# Patient Record
Sex: Female | Born: 1995 | Race: White | Hispanic: No | Marital: Single | Smoking: Never smoker
Health system: Southern US, Community
[De-identification: ages and names within clinical notes are randomized; demographics above are authoritative.]

## PROBLEM LIST (undated history)

## (undated) ENCOUNTER — Inpatient Hospital Stay (HOSPITAL_COMMUNITY): Payer: BLUE CROSS/BLUE SHIELD

## (undated) DIAGNOSIS — A689 Relapsing fever, unspecified: Secondary | ICD-10-CM

## (undated) HISTORY — DX: Relapsing fever, unspecified: A68.9

## (undated) HISTORY — PX: WISDOM TOOTH EXTRACTION: SHX21

---
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
## 2015-05-01 ENCOUNTER — Ambulatory Visit (INDEPENDENT_AMBULATORY_CARE_PROVIDER_SITE_OTHER): Payer: BLUE CROSS/BLUE SHIELD | Admitting: Internal Medicine

## 2015-05-01 VITALS — BP 102/60 | HR 88 | Temp 99.1°F | Resp 16 | Ht 66.0 in | Wt 126.0 lb

## 2015-05-01 DIAGNOSIS — G4700 Insomnia, unspecified: Secondary | ICD-10-CM | POA: Diagnosis not present

## 2015-05-01 DIAGNOSIS — M2550 Pain in unspecified joint: Secondary | ICD-10-CM | POA: Diagnosis not present

## 2015-05-01 DIAGNOSIS — R634 Abnormal weight loss: Secondary | ICD-10-CM | POA: Diagnosis not present

## 2015-05-01 DIAGNOSIS — J029 Acute pharyngitis, unspecified: Secondary | ICD-10-CM | POA: Diagnosis not present

## 2015-05-01 DIAGNOSIS — M542 Cervicalgia: Secondary | ICD-10-CM | POA: Diagnosis not present

## 2015-05-01 DIAGNOSIS — R42 Dizziness and giddiness: Secondary | ICD-10-CM

## 2015-05-01 DIAGNOSIS — R630 Anorexia: Secondary | ICD-10-CM | POA: Diagnosis not present

## 2015-05-01 LAB — POCT CBC
Granulocyte percent: 80.9 % — AB (ref 37–80)
HCT, POC: 41.6 % (ref 37.7–47.9)
Hemoglobin: 14.2 g/dL (ref 12.2–16.2)
Lymph, poc: 1.5 (ref 0.6–3.4)
MCH, POC: 30 pg (ref 27–31.2)
MCHC: 34.2 g/dL (ref 31.8–35.4)
MCV: 87.7 fL (ref 80–97)
MID (cbc): 0.9 (ref 0–0.9)
MPV: 6.9 fL (ref 0–99.8)
POC Granulocyte: 10.2 — AB (ref 2–6.9)
POC LYMPH PERCENT: 12.3 % (ref 10–50)
POC MID %: 6.8 % (ref 0–12)
Platelet Count, POC: 215 K/uL (ref 142–424)
RBC: 4.74 M/uL (ref 4.04–5.48)
RDW, POC: 12.9 %
WBC: 12.6 K/uL — AB (ref 4.6–10.2)

## 2015-05-01 LAB — COMPREHENSIVE METABOLIC PANEL WITH GFR
ALT: 9 U/L (ref 5–32)
AST: 12 U/L (ref 12–32)
Albumin: 4.9 g/dL (ref 3.6–5.1)
Alkaline Phosphatase: 55 U/L (ref 47–176)
BUN: 9 mg/dL (ref 7–20)
CO2: 24 mmol/L (ref 20–31)
Calcium: 9.8 mg/dL (ref 8.9–10.4)
Chloride: 103 mmol/L (ref 98–110)
Creat: 0.78 mg/dL (ref 0.50–1.00)
Glucose, Bld: 89 mg/dL (ref 65–99)
Potassium: 4.2 mmol/L (ref 3.8–5.1)
Sodium: 137 mmol/L (ref 135–146)
Total Bilirubin: 0.5 mg/dL (ref 0.2–1.1)
Total Protein: 7.5 g/dL (ref 6.3–8.2)

## 2015-05-01 LAB — TSH: TSH: 1.42 u[IU]/mL (ref 0.350–4.500)

## 2015-05-01 LAB — POCT SEDIMENTATION RATE: POCT SED RATE: 20 mm/hr (ref 0–22)

## 2015-05-01 MED ORDER — AZITHROMYCIN 250 MG PO TABS: ORAL_TABLET | ORAL | Status: DC

## 2015-05-01 NOTE — Progress Notes (Signed)
Subjective:  This chart was scribed for Andrea Sia, MD by Andrew Au, ED Scribe. This patient was seen in room 4 and the patient's care was started at 8:50 AM.  Patient ID: Andrea Foley, female    DOB: [birth year], 19 y.o.   MRN: [id number]  HPI   Chief Complaint  Patient presents with  . Nasal Congestion    x 2 days   . Neck Pain  . Sore Throat  . Night Sweats   HPI Comments: Andrea Foley is a 19 y.o. female who presents to the [facility] complaining of   Sore throat  Pt has a sore throat and neck pain. She reports associated bloody post nasal drip, cough, worse at night, body aches, pain to both armpits, night sweat and fever. She has tried mucinex and tylenol without relief to symptoms.  Decreased appetite  She's also had a decreased appetite and nausea for the past 2 months. She states she had loss a total of 10-12 lbs within the last few months. Mother has also notice change in appetite and weight.   Light headedness  She reports light headedness and wooziness with quick movement, from sitting to standing. She states she had an episode a couple days ago of feeling light headed while taking a shower and almost blacking out. Pt does light exercising; goes to the park or walk trails a couple times a week. She has noticed achines in joints including her hips knee and ankle before bed time.   Trouble sleeping  She had a metabolic panel at gynecologist 3 month ago. She has trouble sleeping, waking up at 6 am not being able to go back to sleep. Last night she went to sleep at 1am and woke up at 6:50am. When she was seen by gynecologist 3 months ago, she was told she was anxious waking up from sleep due to having palpitation with fast heart rate and nervousness first thing when she wakes up. She denies hx of anxiety but recently had an episode a couple days ago with her friend, having to stop what she was doing and calm herself down..   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 Pt states she left school after becoming pregnant and having an abortion. She is tranfering back to [place] tech in January. She is on birth control but lost her pack for this month, so will restart next month.   She denies urinary frequency, skin rash, leg cramping.    Patient Active Problem List   Diagnosis Date Noted  . Seasonal allergies 09/22/2013   Past Medical History  Diagnosis Date  . Recurrent fever 09/22/2013   Past Surgical History  Procedure Laterality Date  . Wisdom tooth extraction     No Known Allergies Prior to Admission medications   Medication Sig Start Date End Date Taking? Authorizing Provider  benzonatate (TESSALON) 100 MG capsule Take 1-2 capsules (100-200 mg total) by mouth 3 (three) times daily as needed for cough. 09/13/14   Chelle Jeffery, PA-C  Guaifenesin (MUCINEX MAXIMUM STRENGTH) 1200 MG TB12 Take 1 tablet (1,200 mg total) by mouth every 12 (twelve) hours as needed. 09/13/14   Chelle Jeffery, PA-C  ipratropium (ATROVENT) 0.03 % nasal spray Place 2 sprays into both nostrils 2 (two) times daily. 09/13/14   Porfirio Oar, PA-C   Social History   Social History  .  Marital Status: Single    Spouse Name: n/a  . Number of Children: 0  . Years of Education: 12th grade   Occupational History  . waitress     [employer]   Social History Main Topics  . Smoking status: Never Smoker   . Smokeless tobacco: Never Used  . Alcohol Use: No  . Drug Use: Yes    Special: Marijuana     Comment: "not often"  . Sexual Activity: No   Other Topics Concern  . Not on file   Social History Narrative   Graduated from [employer] [id number].   Lives with her best friend.   [place] in [place], [place].   Review of Systems  Constitutional: Positive for fever, diaphoresis, activity change and appetite change.  HENT: Positive for congestion,  ear pain and sore throat.   Respiratory: Positive for cough.   Gastrointestinal: Positive for nausea. Negative for vomiting.  Genitourinary: Negative for dysuria, frequency and difficulty urinating.  Musculoskeletal: Positive for myalgias, arthralgias and neck pain.  Skin: Negative for rash.  Neurological: Positive for light-headedness.       Objective:   Physical Exam  Constitutional: She is oriented to person, place, and time. She appears well-developed and well-nourished. No distress.  HENT:  Head: Normocephalic and atraumatic.  Right Ear: External ear normal.  Left Ear: External ear normal.  Nose: Nose normal.  Mouth/Throat: Oropharynx is clear and moist. No oropharyngeal exudate.  Eyes: Conjunctivae and EOM are normal. Pupils are equal, round, and reactive to light.  Neck: Neck supple.  Tenderness in both SCM's left greater than right. Some tenderness in left trapezius but neck is not rigid    Cardiovascular: Normal rate, regular rhythm and normal heart sounds.   No murmur heard. Pulmonary/Chest: Effort normal and breath sounds normal. She has no wheezes. She has no rales.  Abdominal:  Tender in LUQ with a palpable spleen  Musculoskeletal: Normal range of motion.  Tender in both hips with ROM. No swelling in any of the joints.   Neurological: She is alert and oriented to person, place, and time.  Skin: Skin is warm and dry. No rash noted.  Psychiatric: She has a normal mood and affect. Her behavior is normal.  Nursing note and vitals reviewed.   Filed Vitals:   05/01/15 0843  BP: 102/60  Pulse: 88  Temp: 99.1 F (37.3 C)  TempSrc: Oral  Resp: 16  Height: 5\' 6"  (1.676 m)  Weight: 126 lb (57.153 kg)  SpO2: 98%   Wt Readings from Last 3 Encounters:  05/01/15 126 lb (57.153 kg) (46 %*, Z = -0.11)  09/13/14 139 lb (63.05 kg) (70 %*, Z = 0.51)  01/07/14 147 lb (66.679 kg) (81 %*, Z = 0.86)   * Growth percentiles are based on CDC 2-20 Years data.   Results for  orders placed or performed in visit on 05/01/15  Comprehensive metabolic panel  Result Value Ref Range   Sodium 137 135 - 146 mmol/L   Potassium 4.2 3.8 - 5.1 mmol/L   Chloride 103 98 - 110 mmol/L   CO2 24 20 - 31 mmol/L   Glucose, Bld 89 65 - 99 mg/dL   BUN 9 7 - 20 mg/dL   Creat 2.720.78 5.360.50 - 6.441.00 mg/dL   Total Bilirubin 0.5 0.2 - 1.1 mg/dL   Alkaline Phosphatase 55 47 - 176 U/L   AST 12 12 - 32 U/L   ALT 9 5 - 32 U/L   Total  Protein 7.5 6.3 - 8.2 g/dL   Albumin 4.9 3.6 - 5.1 g/dL   Calcium 9.8 8.9 - 16.1 mg/dL  TSH  Result Value Ref Range   TSH 1.420 0.350 - 4.500 uIU/mL  POCT SEDIMENTATION RATE  Result Value Ref Range   POCT SED RATE 20 0 - 22 mm/hr  POCT CBC  Result Value Ref Range   WBC 12.6 (A) 4.6 - 10.2 K/uL   Lymph, poc 1.5 0.6 - 3.4   POC LYMPH PERCENT 12.3 10 - 50 %L   MID (cbc) 0.9 0 - 0.9   POC MID % 6.8 0 - 12 %M   POC Granulocyte 10.2 (A) 2 - 6.9   Granulocyte percent 80.9 (A) 37 - 80 %G   RBC 4.74 4.04 - 5.48 M/uL   Hemoglobin 14.2 12.2 - 16.2 g/dL   HCT, POC 09.6 04.5 - 47.9 %   MCV 87.7 80 - 97 fL   MCH, POC 30.0 27 - 31.2 pg   MCHC 34.2 31.8 - 35.4 g/dL   RDW, POC 40.9 %   Platelet Count, POC 215 142 - 424 K/uL   MPV 6.9 0 - 99.8 fL   Assessment & Plan:    1. Loss of appetite   2. Arthralgia   3. Sore neck   4. Loss of weight   5. Insomnia   6. Lightheaded     Orders Placed This Encounter  Procedures  . Culture, Group A Strep  . Comprehensive metabolic panel  . TSH  . POCT SEDIMENTATION RATE  . POCT CBC    Meds ordered this encounter  Medications  . azithromycin (ZITHROMAX) 250 MG tablet    Sig: As packaged    Dispense:  6 tablet    Refill:  0   I have completed the patient encounter in its entirety as documented by the scribe, with editing by me where necessary. Leroy Pettway P. Merla Riches, M.D.  Although suspicious for bacterial tonsillitis as one part of this presentation, strep culture was negative. We will reassess her in 1-2  weeks if she is not completely normal.

## 2015-05-02 LAB — CULTURE, GROUP A STREP: ORGANISM ID, BACTERIA: NORMAL

## 2015-05-07 ENCOUNTER — Telehealth: Payer: Self-pay | Admitting: Internal Medicine

## 2015-05-07 NOTE — Telephone Encounter (Signed)
Can we do this for pt? 

## 2015-05-07 NOTE — Telephone Encounter (Signed)
Patient called for several issues:   1. She wants to know her lab results 2. She wants to pick up a copy of her results 3. She is requesting a note for school stating that she was seen in the office on 05/01/2015. States that the note should say something to the effect of "she tried to be seen prior for this but could not because of the wait time." She states that she is going to fail a class if she does not have this note.  [phone]

## 2015-05-07 NOTE — Telephone Encounter (Signed)
Ok to write a note stating that she was seen on the day she was seen - anything further will need to come from Dr Merla Richesoolittle who is out of the office. Her outside labs were all ok.

## 2015-05-10 NOTE — Telephone Encounter (Signed)
Please review labs, I do not see any notes

## 2015-05-12 NOTE — Telephone Encounter (Signed)
Left message for pt to call back  °

## 2015-05-12 NOTE — Telephone Encounter (Signed)
i can write a letter explaining when her illness got worse 7 d before she came in---would that help??

## 2015-05-14 ENCOUNTER — Encounter: Payer: Self-pay | Admitting: Internal Medicine

## 2015-05-14 ENCOUNTER — Telehealth: Payer: Self-pay

## 2015-05-14 NOTE — Telephone Encounter (Signed)
Pt came by and states 7 days earlier is ok.

## 2015-06-14 ENCOUNTER — Encounter (HOSPITAL_COMMUNITY): Payer: Self-pay

## 2015-06-14 ENCOUNTER — Emergency Department (HOSPITAL_COMMUNITY)
Admission: EM | Admit: 2015-06-14 | Discharge: 2015-06-14 | Disposition: A | Discharge status: Home / Self Care | Payer: BLUE CROSS/BLUE SHIELD | Attending: Emergency Medicine | Admitting: Emergency Medicine

## 2015-06-14 DIAGNOSIS — E669 Obesity, unspecified: Secondary | ICD-10-CM | POA: Diagnosis not present

## 2015-06-14 DIAGNOSIS — Z79899 Other long term (current) drug therapy: Secondary | ICD-10-CM | POA: Diagnosis not present

## 2015-06-14 DIAGNOSIS — N912 Amenorrhea, unspecified: Secondary | ICD-10-CM | POA: Insufficient documentation

## 2015-06-14 DIAGNOSIS — F419 Anxiety disorder, unspecified: Secondary | ICD-10-CM | POA: Diagnosis not present

## 2015-06-14 NOTE — ED Provider Notes (Signed)
CSN: [id number]     Arrival date & time 06/14/15  1138 History   None    No chief complaint on file.  chief complaint anxiety Chief complaint anxiety  (Consider location/radiation/quality/duration/timing/severity/associated sxs/prior Treatment) HPI Patient reports she's been waking up with anxiety attacks for the past year. She is brought here this morning on assistance of her mother who accompanies her.. Patient expressed vague thoughts of suicide this morning to her mother, though presently she denies wanting to harm herself or others. She's been treated in the past by her gynecologist with Prozac, without relief of panic attacks. Present she offers no complaints of feeling "tense" nothing makes symptoms better or worse Past Medical History  Diagnosis Date  . Recurrent fever 09/22/2013   Past Surgical History  Procedure Laterality Date  . Wisdom tooth extraction     Family History  Problem Relation Age of Onset  . Hypertension Father   . Hyperlipidemia Father   . Diabetes Father    Social History  Substance Use Topics  . Smoking status: Never Smoker   . Smokeless tobacco: Never Used  . Alcohol Use: No   OB History    No data available     Review of Systems  Constitutional: Negative.   HENT: Negative.   Respiratory: Negative.   Cardiovascular: Negative.   Gastrointestinal: Negative.   Genitourinary:       Amenorrhea  Musculoskeletal: Negative.   Skin: Negative.   Neurological: Negative.   Psychiatric/Behavioral: Positive for dysphoric mood. The patient is nervous/anxious.   All other systems reviewed and are negative.     Allergies  Review of patient's allergies indicates no known allergies.  Home Medications   Prior to Admission medications   Medication Sig Start Date End Date Taking? Authorizing Provider  azithromycin (ZITHROMAX) 250 MG tablet As packaged 05/01/15   Tonye Pearson, MD  benzonatate (TESSALON) 100 MG capsule Take 1-2 capsules (100-200 mg  total) by mouth 3 (three) times daily as needed for cough. 09/13/14   Chelle Jeffery, PA-C  Guaifenesin (MUCINEX MAXIMUM STRENGTH) 1200 MG TB12 Take 1 tablet (1,200 mg total) by mouth every 12 (twelve) hours as needed. 09/13/14   Chelle Jeffery, PA-C  ipratropium (ATROVENT) 0.03 % nasal spray Place 2 sprays into both nostrils 2 (two) times daily. 09/13/14   Chelle Jeffery, PA-C   There were no vitals taken for this visit. Physical Exam  Constitutional: She is oriented to person, place, and time. She appears well-developed and well-nourished.  HENT:  Head: Normocephalic and atraumatic.  Eyes: Conjunctivae are normal. Pupils are equal, round, and reactive to light.  Neck: Neck supple. No tracheal deviation present. No thyromegaly present.  Cardiovascular: Normal rate and regular rhythm.   No murmur heard. Pulmonary/Chest: Effort normal and breath sounds normal.  Abdominal: Soft. Bowel sounds are normal. She exhibits no distension. There is no tenderness.  Obese  Musculoskeletal: Normal range of motion. She exhibits no edema or tenderness.  Neurological: She is alert and oriented to person, place, and time. No cranial nerve deficit. Coordination normal.  Gait normal  Skin: Skin is warm and dry. No rash noted.  Psychiatric: She has a normal mood and affect.  Appears mildly angry  Nursing note and vitals reviewed.   ED Course  Procedures (including critical care time) Labs Review Labs Reviewed - No data to display  Imaging Review No results found. I have personally reviewed and evaluated these images and lab results as part of my medical decision-making.  EKG Interpretation None     Patient interviewed by TTS. MDM  Plan resource furnished for outpatient therapy. Patient urged to call 911 is any thought of harming self Final diagnoses:  None   diagnosis anxiety      Doug SouSam Alizeh Madril, MD 06/14/15 1338

## 2015-06-14 NOTE — ED Notes (Signed)
TTS at bedside. 

## 2015-06-14 NOTE — ED Notes (Addendum)
Pt's mother crying outside room.  Was offered coffee, tissues, and was placed into a private family room to wait for TTS to finish assessment.

## 2015-06-14 NOTE — Discharge Instructions (Signed)
Generalized Anxiety Disorder Call the telephone number that you've  Been furnished today to schedule the next available appointment for counseling for anxiety. If you have any thought of harming yourself or someone else call 911 immediately. Generalized anxiety disorder (GAD) is a mental disorder. It interferes with life functions, including relationships, work, and school. GAD is different from normal anxiety, which everyone experiences at some point in their lives in response to specific life events and activities. Normal anxiety actually helps us prepare for and get through these life events and activities. Normal anxiety goes away after the event or activity is over.  GAD causes anxiety that is not necessarily related to specific events or activities. It also causes excess anxiety in proportion to specific events or activities. The anxiety associated with GAD is also difficult to control. GAD can vary from mild to severe. People with severe GAD can have intense waves of anxiety with physical symptoms (panic attacks).  SYMPTOMS The anxiety and worry associated with GAD are difficult to control. This anxiety and worry are related to many life events and activities and also occur more days than not for 6 months or longer. People with GAD also have three or more of the following symptoms (one or more in children):  Restlessness.   Fatigue.  Difficulty concentrating.   Irritability.  Muscle tension.  Difficulty sleeping or unsatisfying sleep. DIAGNOSIS GAD is diagnosed through an assessment by your health care provider. Your health care provider will ask you questions aboutyour mood,physical symptoms, and events in your life. Your health care provider may ask you about your medical history and use of alcohol or drugs, including prescription medicines. Your health care provider may also do a physical exam and blood tests. Certain medical conditions and the use of certain substances can cause  symptoms similar to those associated with GAD. Your health care provider may refer you to a mental health specialist for further evaluation. TREATMENT The following therapies are usually used to treat GAD:   Medication. Antidepressant medication usually is prescribed for long-term daily control. Antianxiety medicines may be added in severe cases, especially when panic attacks occur.   Talk therapy (psychotherapy). Certain types of talk therapy can be helpful in treating GAD by providing support, education, and guidance. A form of talk therapy called cognitive behavioral therapy can teach you healthy ways to think about and react to daily life events and activities.  Stress managementtechniques. These include yoga, meditation, and exercise and can be very helpful when they are practiced regularly. A mental health specialist can help determine which treatment is best for you. Some people see improvement with one therapy. However, other people require a combination of therapies.   This information is not intended to replace advice given to you by your health care provider. Make sure you discuss any questions you have with your health care provider.   Document Released: 09/16/2012 Document Revised: 06/12/2014 Document Reviewed: 09/16/2012 Elsevier Interactive Patient Education [employer]2016 [employer] Inc.
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
## 2015-06-14 NOTE — ED Notes (Addendum)
Pt c/o increasing anxiety x 1 year.  Pt reports being seen previously by PCP for same.  Sts "they put me on Prozac one time for depression."  Denies SI/HI/AV.  Sts "I'm not crazy dude."  Mother reports Pt "has been battling this on and off for a while and needs to get it under control."  Pt is supposed to start college on Friday.

## 2015-06-19 ENCOUNTER — Encounter (HOSPITAL_COMMUNITY): Payer: Self-pay | Admitting: Emergency Medicine

## 2015-06-19 ENCOUNTER — Emergency Department (INDEPENDENT_AMBULATORY_CARE_PROVIDER_SITE_OTHER)
Admission: EM | Admit: 2015-06-19 | Discharge: 2015-06-19 | Disposition: A | Discharge status: Home / Self Care | Payer: BLUE CROSS/BLUE SHIELD | Source: Home / Self Care

## 2015-06-19 DIAGNOSIS — J069 Acute upper respiratory infection, unspecified: Secondary | ICD-10-CM

## 2015-06-19 DIAGNOSIS — K529 Noninfective gastroenteritis and colitis, unspecified: Secondary | ICD-10-CM | POA: Diagnosis not present

## 2015-06-19 DIAGNOSIS — E860 Dehydration: Secondary | ICD-10-CM

## 2015-06-19 LAB — POCT URINALYSIS DIP (DEVICE)
GLUCOSE, UA: NEGATIVE mg/dL
Leukocytes, UA: NEGATIVE
Nitrite: NEGATIVE
PROTEIN: 100 mg/dL — AB
Urobilinogen, UA: 0.2 mg/dL (ref 0.0–1.0)
pH: 6 (ref 5.0–8.0)

## 2015-06-19 LAB — POCT PREGNANCY, URINE: Preg Test, Ur: NEGATIVE

## 2015-06-19 MED ORDER — OSELTAMIVIR PHOSPHATE 75 MG PO CAPS: 75.0000 mg | ORAL_CAPSULE | Freq: Two times a day (BID) | ORAL | Status: AC

## 2015-06-19 MED ORDER — PROMETHAZINE HCL 25 MG PO TABS: 25.0000 mg | ORAL_TABLET | Freq: Four times a day (QID) | ORAL | Status: DC | PRN

## 2015-06-19 NOTE — ED Provider Notes (Addendum)
CSN: [id number]     Arrival date & time 06/19/15  1325 History   None    No chief complaint on file.  (Consider location/radiation/quality/duration/timing/severity/associated sxs/prior Treatment) Patient is a 20 y.o. female presenting with cough and vomiting. The history is provided by the patient. No language interpreter was used.  Cough Cough characteristics:  Non-productive Severity:  Moderate Onset quality:  Gradual Timing:  Constant (Feels better with mucinex) Progression:  Waxing and waning Chronicity:  New Smoker: no   Context: sick contacts   Context: not occupational exposure and not smoke exposure   Context comment:  Three dogs at home, denies animal allergies. Was in contact with a friend who was recently diagnosed with flu Relieved by:  Cough suppressants Exacerbated by: worse in the morning. And when she starts coughing she is unable to stop as quickly as possible. Associated symptoms: fever, myalgias, rhinorrhea, shortness of breath, sinus congestion and wheezing   Associated symptoms: no chest pain, no ear pain, no eye discharge, no headaches, no rash and no sore throat   Associated symptoms comment:  Measured after taking tylenol, temp check was 99 Emesis Severity: Feels naseous right after eating. She has not been able to keep food down in 2 days. Now she has poor appetite. Duration:  3 days Timing:  Constant Quality:  Stomach contents and bilious material Able to tolerate:  Liquids How soon after eating does vomiting occur:  1 second Progression:  Worsening Chronicity:  New Recent urination:  Normal Context: not post-tussive and not self-induced   Relieved by: not eating. Worsened by:  Food smell Associated symptoms: abdominal pain, arthralgias, cough, fever and myalgias   Associated symptoms: no headaches and no sore throat   Associated symptoms comment:  If she eats a lot she will have diarrhea, she has some belly pain on and off Risk factors: sick contacts    Risk factors: no suspect food intake and no travel to endemic areas   Risk factors comment:  LMP was 06/03/15   Past Medical History  Diagnosis Date  . Recurrent fever 09/22/2013   Past Surgical History  Procedure Laterality Date  . Wisdom tooth extraction     Family History  Problem Relation Age of Onset  . Hypertension Father   . Hyperlipidemia Father   . Diabetes Father    Social History  Substance Use Topics  . Smoking status: Never Smoker   . Smokeless tobacco: Never Used  . Alcohol Use: 0.0 oz/week    0 Standard drinks or equivalent per week     Comment: occ   OB History    No data available     Review of Systems  Constitutional: Positive for fever.  HENT: Positive for rhinorrhea. Negative for ear pain and sore throat.   Eyes: Negative for discharge.  Respiratory: Positive for cough, shortness of breath and wheezing.   Cardiovascular: Negative.  Negative for chest pain.  Gastrointestinal: Positive for vomiting and abdominal pain.  Genitourinary: Negative.   Musculoskeletal: Positive for myalgias and arthralgias.  Skin: Negative for rash.  Neurological: Negative for headaches.  All other systems reviewed and are negative.   Allergies  Review of patient's allergies indicates no known allergies.  Home Medications   Prior to Admission medications   Medication Sig Start Date End Date Taking? Authorizing Provider  azithromycin (ZITHROMAX) 250 MG tablet As packaged 05/01/15   Tonye Pearson, MD  benzonatate (TESSALON) 100 MG capsule Take 1-2 capsules (100-200 mg total) by mouth 3 (three)  times daily as needed for cough. 09/13/14   Chelle Jeffery, PA-C  Guaifenesin (MUCINEX MAXIMUM STRENGTH) 1200 MG TB12 Take 1 tablet (1,200 mg total) by mouth every 12 (twelve) hours as needed. 09/13/14   Chelle Jeffery, PA-C  ipratropium (ATROVENT) 0.03 % nasal spray Place 2 sprays into both nostrils 2 (two) times daily. 09/13/14   Porfirio Oarhelle Jeffery, PA-C   Meds Ordered and  Administered this Visit  Medications - No data to display  LMP 05/30/2015 No data found.   Physical Exam  Constitutional: She is oriented to person, place, and time. She appears well-developed. No distress.  HENT:  Head: Normocephalic.  Right Ear: Tympanic membrane, external ear and ear canal normal.  Left Ear: Tympanic membrane, external ear and ear canal normal.  Mouth/Throat: Uvula is midline, oropharynx is clear and moist and mucous membranes are normal.  Cardiovascular: Normal rate, regular rhythm and normal heart sounds.   No murmur heard. Pulmonary/Chest: Effort normal and breath sounds normal. No respiratory distress. She has no wheezes. She has no rales. She exhibits no tenderness.  Abdominal: Soft. Bowel sounds are normal. She exhibits no distension and no mass. There is no tenderness.  Musculoskeletal: Normal range of motion.  Neurological: She is alert and oriented to person, place, and time. She has normal reflexes. No cranial nerve deficit.  Nursing note and vitals reviewed.   ED Course  Procedures (including critical care time)  Labs Review Labs Reviewed - No data to display  Imaging Review No results found.   Visual Acuity Review  Right Eye Distance:   Left Eye Distance:   Bilateral Distance:    Right Eye Near:   Left Eye Near:    Bilateral Near:      Urinalysis    Component Value Date/Time   LABSPEC >=1.030 06/19/2015 1433   PHURINE 6.0 06/19/2015 1433   GLUCOSEU NEGATIVE 06/19/2015 1433   HGBUR MODERATE* 06/19/2015 1433   BILIRUBINUR SMALL* 06/19/2015 1433   BILIRUBINUR neg 08/29/2013 1516   KETONESUR >=160* 06/19/2015 1433   PROTEINUR 100* 06/19/2015 1433   PROTEINUR 30 08/29/2013 1516   UROBILINOGEN 0.2 06/19/2015 1433   UROBILINOGEN 0.2 08/29/2013 1516   NITRITE NEGATIVE 06/19/2015 1433   NITRITE neg 08/29/2013 1516   LEUKOCYTESUR NEGATIVE 06/19/2015 1433     Pregnancy test Neg   MDM  No diagnosis found. Upper respiratory  infection, viral  Gastroenteritis  Mild dehydration  Concern for influenza due to hx of exposure and symptoms. Tamiflu prescribed. May use Tylenol as needed for pain and fever. OTC cough syrup is appropriate. Phernegan prescribed prn N/V. I recommended improve hydration and rest at home. UA suggest dehydration with ketone and protein in urine. She also has hgb in her urine. I recommended repeat UA 1-2 weeks after feeling better. She will f/u with her PCP.    Doreene ElandKehinde T Lashawne Dura, MD 06/19/15 1505  Doreene ElandKehinde T Finnegan Gatta, MD 06/19/15 [phone]

## 2015-06-19 NOTE — Discharge Instructions (Signed)

## 2015-06-19 NOTE — ED Notes (Signed)
Pt is here today with c/o loss of appetite, nausea and vomiting and slight abdominal cramp intermit x 1 week Seen in ER and treated with anxiety issues LMP- 12/29 othostats negative

## 2016-03-08 ENCOUNTER — Ambulatory Visit (HOSPITAL_COMMUNITY)
Admission: EM | Admit: 2016-03-08 | Discharge: 2016-03-08 | Disposition: A | Discharge status: Home / Self Care | Payer: BLUE CROSS/BLUE SHIELD | Attending: Family Medicine | Admitting: Family Medicine

## 2016-03-08 ENCOUNTER — Encounter (HOSPITAL_COMMUNITY): Payer: Self-pay | Admitting: Emergency Medicine

## 2016-03-08 DIAGNOSIS — N3000 Acute cystitis without hematuria: Secondary | ICD-10-CM | POA: Diagnosis not present

## 2016-03-08 DIAGNOSIS — R109 Unspecified abdominal pain: Secondary | ICD-10-CM | POA: Diagnosis present

## 2016-03-08 LAB — POCT URINALYSIS DIP (DEVICE)
Glucose, UA: 250 mg/dL — AB
Hgb urine dipstick: NEGATIVE
Nitrite: POSITIVE — AB
PH: 8.5 — AB (ref 5.0–8.0)
Specific Gravity, Urine: 1.015 (ref 1.005–1.030)
Urobilinogen, UA: 4 mg/dL — ABNORMAL HIGH (ref 0.0–1.0)

## 2016-03-08 LAB — POCT PREGNANCY, URINE: PREG TEST UR: NEGATIVE

## 2016-03-08 MED ORDER — PHENAZOPYRIDINE HCL 200 MG PO TABS: 200.0000 mg | ORAL_TABLET | Freq: Three times a day (TID) | ORAL | 0 refills | Status: AC

## 2016-03-08 MED ORDER — NITROFURANTOIN MONOHYD MACRO 100 MG PO CAPS: 100.0000 mg | ORAL_CAPSULE | Freq: Two times a day (BID) | ORAL | 0 refills | Status: AC

## 2016-03-08 NOTE — ED Provider Notes (Signed)
CSN: [id number]     Arrival date & time 03/08/16  1042 History   First MD Initiated Contact with Patient 03/08/16 1223     Chief Complaint  Patient presents with  . Abdominal Pain   (Consider location/radiation/quality/duration/timing/severity/associated sxs/prior Treatment) Patient presents today for possible UTI. She reports lower abdominal pain, bilateral flank pain, severe dysuria, urinary frequency and urgency. She also endorses vaginal discharge but is not any abnormal that her normal vaginal discharge.       Past Medical History:  Diagnosis Date  . Recurrent fever 09/22/2013   Past Surgical History:  Procedure Laterality Date  . WISDOM TOOTH EXTRACTION     Family History  Problem Relation Age of Onset  . Hypertension Father   . Hyperlipidemia Father   . Diabetes Father    Social History  Substance Use Topics  . Smoking status: Never Smoker  . Smokeless tobacco: Never Used  . Alcohol use 0.0 oz/week     Comment: occ   OB History    No data available     Review of Systems  Constitutional: Negative for chills, fatigue and fever.  Gastrointestinal: Positive for abdominal pain. Negative for diarrhea, nausea and vomiting.  Genitourinary: Positive for dysuria, flank pain, frequency, urgency and vaginal discharge.       Reports vaginal discharge is not any abnormal  Neurological: Negative for dizziness and headaches.    Allergies  Review of patient's allergies indicates no known allergies.  Home Medications   Prior to Admission medications   Medication Sig Start Date End Date Taking? Authorizing Provider  azithromycin (ZITHROMAX) 250 MG tablet As packaged 05/01/15   Tonye Pearsonobert P Doolittle, MD  benzonatate (TESSALON) 100 MG capsule Take 1-2 capsules (100-200 mg total) by mouth 3 (three) times daily as needed for cough. 09/13/14   Chelle Jeffery, PA-C  Guaifenesin (MUCINEX MAXIMUM STRENGTH) 1200 MG TB12 Take 1 tablet (1,200 mg total) by mouth every 12 (twelve) hours as  needed. 09/13/14   Chelle Jeffery, PA-C  ipratropium (ATROVENT) 0.03 % nasal spray Place 2 sprays into both nostrils 2 (two) times daily. 09/13/14   Chelle Jeffery, PA-C  nitrofurantoin, macrocrystal-monohydrate, (MACROBID) 100 MG capsule Take 1 capsule (100 mg total) by mouth 2 (two) times daily. 03/08/16 03/13/16  Lucia EstelleFeng Christopher Hink, NP  phenazopyridine (PYRIDIUM) 200 MG tablet Take 1 tablet (200 mg total) by mouth 3 (three) times daily. 03/08/16 03/10/16  Lucia EstelleFeng Brenisha Tsui, NP  promethazine (PHENERGAN) 25 MG tablet Take 1 tablet (25 mg total) by mouth every 6 (six) hours as needed for nausea or vomiting. 06/19/15   Doreene ElandKehinde T Eniola, MD   Meds Ordered and Administered this Visit  Medications - No data to display  BP (!) 109/53 (BP Location: Left Arm) Comment: reported BP to CMA [facility]  Pulse 69   Temp 98.5 F (36.9 C) (Oral)   Resp 16   LMP 02/22/2016 (Exact Date)   SpO2 100%  No data found.   Physical Exam  Constitutional: She is oriented to person, place, and time. She appears well-developed and well-nourished.  HENT:  Head: Normocephalic and atraumatic.  Cardiovascular: Normal rate, regular rhythm and normal heart sounds.   Pulmonary/Chest: Effort normal and breath sounds normal.  Abdominal: Soft. Bowel sounds are normal.  +suprapubic tenderness present.   Genitourinary:  Genitourinary Comments: +Bilateral CVA tenderness (Left worst than right)  Neurological: She is alert and oriented to person, place, and time.  Nursing note and vitals reviewed.   Urgent Care Course   Clinical  Course    Procedures (including critical care time)  Labs Review Labs Reviewed  POCT URINALYSIS DIP (DEVICE) - Abnormal; Notable for the following:       Result Value   Glucose, UA 250 (*)    Bilirubin Urine SMALL (*)    Ketones, ur TRACE (*)    pH 8.5 (*)    Protein, ur >=300 (*)    Urobilinogen, UA 4.0 (*)    Nitrite POSITIVE (*)    Leukocytes, UA LARGE (*)    All other components within normal  limits  URINE CULTURE  POCT PREGNANCY, URINE    Imaging Review No results found.      MDM   1. Acute cystitis without hematuria     UA indicatives of UTI. Macrobid and Pyridium given. Reviewed directions for usage and side effects. Patient states understanding and will call with questions or problems. Patient instructed to call or follow up with his/her primary care doctor if failure to improve or change in symptoms. Discharge instruction given.    Lucia Estelle, NP 03/08/16 [phone]

## 2016-03-08 NOTE — Discharge Instructions (Signed)
Take the medications as prescribed. We send your urine off for a urine culture. Drink a lot of water with the pyridium medication. Follow up with your regular doctor if you do not improve.

## 2016-03-08 NOTE — ED Triage Notes (Signed)
[facility] a complaint of suprapubic pain as well as low back pain with urinary frequency and urgency that started 2 days ago.

## 2016-03-10 LAB — URINE CULTURE

## 2017-10-10 ENCOUNTER — Emergency Department (HOSPITAL_COMMUNITY): Payer: BLUE CROSS/BLUE SHIELD

## 2017-10-10 ENCOUNTER — Other Ambulatory Visit: Payer: Self-pay

## 2017-10-10 ENCOUNTER — Encounter (HOSPITAL_COMMUNITY): Payer: Self-pay

## 2017-10-10 ENCOUNTER — Emergency Department (HOSPITAL_COMMUNITY)
Admission: EM | Admit: 2017-10-10 | Discharge: 2017-10-10 | Disposition: A | Discharge status: Home / Self Care | Payer: BLUE CROSS/BLUE SHIELD | Attending: Emergency Medicine | Admitting: Emergency Medicine

## 2017-10-10 DIAGNOSIS — Z79899 Other long term (current) drug therapy: Secondary | ICD-10-CM | POA: Insufficient documentation

## 2017-10-10 DIAGNOSIS — O039 Complete or unspecified spontaneous abortion without complication: Secondary | ICD-10-CM | POA: Diagnosis not present

## 2017-10-10 DIAGNOSIS — Z3A00 Weeks of gestation of pregnancy not specified: Secondary | ICD-10-CM | POA: Diagnosis not present

## 2017-10-10 LAB — BASIC METABOLIC PANEL
Anion gap: 8 (ref 5–15)
BUN: 8 mg/dL (ref 6–20)
CO2: 26 mmol/L (ref 22–32)
Calcium: 9.7 mg/dL (ref 8.9–10.3)
Chloride: 109 mmol/L (ref 101–111)
Creatinine, Ser: 0.65 mg/dL (ref 0.44–1.00)
GFR calc Af Amer: >60 mL/min (ref >60)
GFR calc non Af Amer: >60 mL/min (ref >60)
Glucose, Bld: 102 mg/dL — ABNORMAL HIGH (ref 65–99)
Potassium: 4.6 mmol/L (ref 3.5–5.1)
Sodium: 143 mmol/L (ref 135–145)

## 2017-10-10 LAB — CBC WITH DIFFERENTIAL/PLATELET
BASOS ABS: 0 10*3/uL (ref 0.0–0.1)
Basophils Relative: 0 %
EOS ABS: 0.2 10*3/uL (ref 0.0–0.7)
EOS PCT: 3 %
HCT: 35.5 % — ABNORMAL LOW (ref 36.0–46.0)
HEMOGLOBIN: 11.9 g/dL — AB (ref 12.0–15.0)
LYMPHS PCT: 21 %
Lymphs Abs: 1.6 10*3/uL (ref 0.7–4.0)
MCH: 30.9 pg (ref 26.0–34.0)
MCHC: 33.5 g/dL (ref 30.0–36.0)
MCV: 92.2 fL (ref 78.0–100.0)
Monocytes Absolute: 0.3 10*3/uL (ref 0.1–1.0)
Monocytes Relative: 5 %
Neutro Abs: 5.2 10*3/uL (ref 1.7–7.7)
Neutrophils Relative %: 71 %
PLATELETS: 250 10*3/uL (ref 150–400)
RBC: 3.85 MIL/uL — AB (ref 3.87–5.11)
RDW: 12.5 % (ref 11.5–15.5)
WBC: 7.3 10*3/uL (ref 4.0–10.5)

## 2017-10-10 LAB — I-STAT BETA HCG BLOOD, ED (MC, WL, AP ONLY): HCG, QUANTITATIVE: 1224.1 m[IU]/mL — AB (ref <5)

## 2017-10-10 LAB — ABO/RH
ABO/RH(D): O NEG
ANTIBODY SCREEN: NEGATIVE

## 2017-10-10 LAB — HCG, QUANTITATIVE, PREGNANCY: HCG, BETA CHAIN, QUANT, S: 1498 m[IU]/mL — AB (ref <5)

## 2017-10-10 MED ORDER — ONDANSETRON HCL 4 MG/2ML IJ SOLN
4.0000 mg | Freq: Once | INTRAMUSCULAR | Status: AC | PRN
  Administered 2017-10-10: 4 mg via INTRAVENOUS
  Filled 2017-10-10: qty 2

## 2017-10-10 MED ORDER — ONDANSETRON 4 MG PO TBDP: 4.0000 mg | ORAL_TABLET | Freq: Three times a day (TID) | ORAL | 0 refills | Status: DC | PRN

## 2017-10-10 MED ORDER — KETOROLAC TROMETHAMINE 30 MG/ML IJ SOLN
30.0000 mg | Freq: Once | INTRAMUSCULAR | Status: AC
  Administered 2017-10-10: 30 mg via INTRAVENOUS
  Filled 2017-10-10: qty 1

## 2017-10-10 MED ORDER — RHO D IMMUNE GLOBULIN 1500 UNIT/2ML IJ SOSY
300.0000 ug | PREFILLED_SYRINGE | Freq: Once | INTRAMUSCULAR | Status: AC
  Administered 2017-10-10: 300 ug via INTRAVENOUS

## 2017-10-10 MED ORDER — HYDROMORPHONE HCL 2 MG/ML IJ SOLN
0.5000 mg | Freq: Once | INTRAMUSCULAR | Status: AC
Start: 2017-10-10 — End: 2017-10-10
  Administered 2017-10-10: 0.5 mg via INTRAVENOUS
  Filled 2017-10-10: qty 1

## 2017-10-10 MED ORDER — HYDROMORPHONE HCL 2 MG/ML IJ SOLN
1.0000 mg | Freq: Once | INTRAMUSCULAR | Status: AC
  Administered 2017-10-10: 1 mg via INTRAVENOUS
  Filled 2017-10-10: qty 1

## 2017-10-10 NOTE — ED Notes (Signed)
Patient verbalizes understanding of discharge instructions. Opportunity for questioning and answers were provided. Armband removed by staff, pt discharged from ED ambulatory.   

## 2017-10-10 NOTE — ED Triage Notes (Signed)
Patient complains of abortion on 4/8 by medication. Yesterday developed severe lower pelvic pain with back pain. Nausea and vomiting with same.

## 2017-10-10 NOTE — ED Provider Notes (Signed)
[facility] EMERGENCY DEPARTMENT Provider Note   CSN: [id number] Arrival date & time: 10/10/17  1016     History   Chief Complaint No chief complaint on file.   HPI Andrea Foley is a 22 y.o. female.  HPI   Ms. Andrea Foley is a 22yo female with no significant past medical history who presents to the emergency department for evaluation of vaginal bleeding and pelvic cramping.  Patient reports that she had a medically induced abortion 09/10/2017. Reports being 7wk6d pregnant at the time. Had bleeding for about 4 days after taking Mifeprex.  She was doing well until yesterday afternoon when she stood up and felt severe suprapubic and right-sided abdominal cramping which comes in waves and is 8/10 in severity at its worst.  She reports that pain radiates to her back at times. She felt "as if my water broke" and has been having profuse vaginal bleeding with large clots. She has felt nauseated, vomited five times today. Also had diarrhea today. Denies fever, chills, urinary frequency, dysuria, flank pain, chest pain, sob, lightheadedness, syncope. She denies previous abdominal surgeries. States that she had an [place] prior to the abortion which was "normal." She reports that she refused RhoGAM. She has an OB/GYN in [place] who she is going to follow up with to get IUD.  Denies blood thinner use.  Past Medical History:  Diagnosis Date  . 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 Seasonal allergies 09/22/2013    Past Surgical History:  Procedure Laterality Date  . WISDOM TOOTH EXTRACTION       OB History   None      Home Medications    Prior to Admission medications   Medication Sig Start Date End Date Taking? Authorizing Provider  azithromycin (ZITHROMAX) 250 MG tablet As packaged 05/01/15   Tonye Pearson, MD  benzonatate (TESSALON) 100 MG capsule Take 1-2 capsules (100-200 mg total) by mouth 3 (three) times daily as needed  for cough. 09/13/14   Porfirio Oar, PA-C  Guaifenesin (MUCINEX MAXIMUM STRENGTH) 1200 MG TB12 Take 1 tablet (1,200 mg total) by mouth every 12 (twelve) hours as needed. 09/13/14   Jeffery, Chelle, PA-C  ipratropium (ATROVENT) 0.03 % nasal spray Place 2 sprays into both nostrils 2 (two) times daily. 09/13/14   Porfirio Oar, PA-C  promethazine (PHENERGAN) 25 MG tablet Take 1 tablet (25 mg total) by mouth every 6 (six) hours as needed for nausea or vomiting. 06/19/15   Doreene Eland, MD    Family History Family History  Problem Relation Age of Onset  . Hypertension Father   . Hyperlipidemia Father   . Diabetes Father     Social History Social History   Tobacco Use  . Smoking status: Never Smoker  . Smokeless tobacco: Never Used  Substance Use Topics  . Alcohol use: Yes    Alcohol/week: 0.0 oz    Comment: occ  . Drug use: Yes    Types: Marijuana     Allergies   Patient has no known allergies.   Review of Systems Review of Systems  Constitutional: Negative for chills and fever.  Respiratory: Negative for shortness of breath.   Cardiovascular: Negative for chest pain.  Gastrointestinal: Positive for diarrhea, nausea and vomiting.  Genitourinary: Positive for pelvic pain and vaginal bleeding. Negative for difficulty urinating, dysuria, flank pain and frequency.  Musculoskeletal: Negative for gait problem.  Skin: Negative for rash.  Neurological: Negative  for syncope and light-headedness.  Hematological: Does not bruise/bleed easily.  Psychiatric/Behavioral: Negative for agitation.     Physical Exam Updated Vital Signs BP 123/81 (BP Location: Right Arm)   Pulse 84   Temp 97.7 F (36.5 C) (Oral)   Resp 20   SpO2 100%   Physical Exam  Constitutional: She is oriented to person, place, and time. She appears well-developed and well-nourished. No distress.  HENT:  Head: Normocephalic and atraumatic.  Mouth/Throat: Oropharynx is clear and moist. No oropharyngeal  exudate.  Eyes: Pupils are equal, round, and reactive to light. Right eye exhibits no discharge. Left eye exhibits no discharge.  Neck: Normal range of motion. Neck supple.  Cardiovascular: Normal rate and regular rhythm. Exam reveals no friction rub.  No murmur heard. Pulmonary/Chest: Effort normal and breath sounds normal. No stridor. No respiratory distress. She has no wheezes. She has no rales.  Abdominal:  Abdomen soft and nondistended.  Acutely tender to palpation in the suprapubic area.  No guarding or rigidity.  No rebound tenderness.  Genitourinary:  Genitourinary Comments: Chaperone present for exam. Large 4cm piece of tissue in the vaginal vault which was extracted. Vaginal bleeding present. No CMT. No adnexal masses, tenderness, or fullness.   Neurological: She is alert and oriented to person, place, and time. Coordination normal.  Skin: Skin is warm and dry. Capillary refill takes less than 2 seconds. She is not diaphoretic.  Psychiatric: She has a normal mood and affect. Her behavior is normal.  Nursing note and vitals reviewed.    ED Treatments / Results  Labs (all labs ordered are listed, but only abnormal results are displayed) Labs Reviewed  HCG, QUANTITATIVE, PREGNANCY - Abnormal; Notable for the following components:      Result Value   hCG, Beta Chain, Quant, S 1,498 (*)    All other components within normal limits  CBC WITH DIFFERENTIAL/PLATELET - Abnormal; Notable for the following components:   RBC 3.85 (*)    Hemoglobin 11.9 (*)    HCT 35.5 (*)    All other components within normal limits  BASIC METABOLIC PANEL - Abnormal; Notable for the following components:   Glucose, Bld 102 (*)    All other components within normal limits  I-STAT BETA HCG BLOOD, ED ([facility], [facility], [facility] ONLY) - Abnormal; Notable for the following components:   I-stat hCG, quantitative 1,224.1 (*)    All other components within normal limits  ABO/RH  RH IG WORKUP (INCLUDES ABO/RH)     EKG None  Radiology US Ob Comp < 14 Wks  Result Date: 10/10/2017 CLINICAL DATA:  Medically induced abortion 1 month ago. Passed products of conception today. EXAM: OBSTETRIC <14 WK [place] AND TRANSVAGINAL OB US TECHNIQUE: Both transabdominal and transvaginal ultrasound examinations were performed for complete evaluation of the gestation as well as the maternal uterus, adnexal regions, and pelvic cul-de-sac. Transvaginal technique was performed to assess early pregnancy. COMPARISON:  None. FINDINGS: Intrauterine gestational sac: None. Yolk sac:  None. Embryo:  None. Cardiac Activity: None. Maternal uterus/adnexae: Trace free fluid is noted which may be physiologic. Ovaries are unremarkable. Endometrium is thickened and complex in appearance most consistent with residual clot. Doppler demonstrates no definite evidence of retained products of conception. IMPRESSION: No intrauterine gestational sac or pregnancy is noted. Thickened heterogeneous endometrium is noted most consistent with residual clot. Electronically Signed   By: Lupita Raider, M.D.   On: 10/10/2017 14:45   US Ob Transvaginal  Result Date: 10/10/2017 CLINICAL DATA:  Medically  induced abortion 1 month ago. Passed products of conception today. EXAM: OBSTETRIC <14 WK [place] AND TRANSVAGINAL OB US TECHNIQUE: Both transabdominal and transvaginal ultrasound examinations were performed for complete evaluation of the gestation as well as the maternal uterus, adnexal regions, and pelvic cul-de-sac. Transvaginal technique was performed to assess early pregnancy. COMPARISON:  None. FINDINGS: Intrauterine gestational sac: None. Yolk sac:  None. Embryo:  None. Cardiac Activity: None. Maternal uterus/adnexae: Trace free fluid is noted which may be physiologic. Ovaries are unremarkable. Endometrium is thickened and complex in appearance most consistent with residual clot. Doppler demonstrates no definite evidence of retained products of conception. IMPRESSION:  No intrauterine gestational sac or pregnancy is noted. Thickened heterogeneous endometrium is noted most consistent with residual clot. Electronically Signed   By: Lupita Raider, M.D.   On: 10/10/2017 14:45    Procedures Procedures (including critical care time)  Medications Ordered in ED Medications  ondansetron (ZOFRAN) injection 4 mg (4 mg Intravenous Given 10/10/17 1148)  HYDROmorphone (DILAUDID) injection 1 mg (1 mg Intravenous Given 10/10/17 1217)  rho (d) immune globulin (RHIG/RHOPHYLAC) injection 300 mcg (300 mcg Intravenous Given 10/10/17 1506)  HYDROmorphone (DILAUDID) injection 0.5 mg (0.5 mg Intravenous Given 10/10/17 1513)  ketorolac (TORADOL) 30 MG/ML injection 30 mg (30 mg Intravenous Given 10/10/17 1513)     Initial Impression / Assessment and Plan / ED Course  I have reviewed the triage vital signs and the nursing notes.  Pertinent labs & imaging results that were available during my care of the patient were reviewed by me and considered in my medical decision making (see chart for details).    Patient with medically induced abortion a month ago presents to the emergency department for evaluation of pelvic pain and vaginal bleeding.  Pelvic exam with large piece of tissue extracted from the vaginal vault, likely products of conception.  Beta hCG 1498.  Blood type O-, RhoGam given in the emergency department.  CBC with stable hemoglobin (11.9.)  Transvaginal ultrasound reveals no evidence of intrauterine pregnancy or gestational sac.  Patient likely completed her abortion in the emergency department.  Have counseled her to follow-up with OB/GYN in a week for recheck. Her pain was managed in the ED. She is able to tolerate PO fluids. Discussed NSAIDs and zofran as needed at home for symptoms. Counseled her on return precautions and she agrees and voiced understanding to the above plan and has no complaints prior to discharge.  This was a shared visit with Dr. Jeraldine Loots who also saw the  patient and agrees with plan and discharge home.  Final Clinical Impressions(s) / ED Diagnoses   Final diagnoses:  Complete abortion    ED Discharge Orders        Ordered    ondansetron (ZOFRAN ODT) 4 MG disintegrating tablet  Every 8 hours PRN     10/10/17 1503       Lawrence Marseilles 10/10/17 Remigio Eisenmenger, MD 10/11/17 [phone]

## 2017-10-10 NOTE — ED Notes (Signed)
Patient found in restroom calling for help.  Assisted by Vernona Rieger EMT.  Patient placed in wheelchair and taken back to hall bed.

## 2017-10-10 NOTE — ED Notes (Signed)
ED Provider at bedside. 

## 2017-10-10 NOTE — ED Notes (Signed)
MD/PA made aware of blood type from blood bank. Will add additional orders.

## 2017-10-10 NOTE — ED Notes (Signed)
Pt refusing IV and zofran for nausea at this time. Pt informed that no pain medicine may be given at this time r/t no physician being present in Pod E until 1200. Pt stated "what the hell kind of place is this? It's the goddamn emergency room and there are no doctors here, I'm in pain I need medicine for pain and that won't even make me sleepy. Yall better hope I dont die in here waiting for a doctor." Will continue to monitor, mother bedside at this time.

## 2017-10-10 NOTE — Discharge Instructions (Addendum)
Please follow-up with your gynecologist in a week for recheck.  Your beta hCG was 1498 today.   As we discussed, please take ibuprofen every 6 hours at home as needed for stomach cramping.  I also written you prescription for Zofran which she can use as needed for nausea and vomiting.  Return to emergency department if you have any new or concerning symptoms like vomiting that is not controlled by medicine, fever greater than 100.4 F, worsening abdominal cramping/pain, or you are continuing to pass large clots in 72hrs.

## 2017-10-11 LAB — RH IG WORKUP (INCLUDES ABO/RH)
ABO/RH(D): O NEG
ANTIBODY SCREEN: NEGATIVE
GESTATIONAL AGE(WKS): 7
Unit division: 0

## 2017-10-18 ENCOUNTER — Other Ambulatory Visit: Payer: Self-pay

## 2017-10-18 ENCOUNTER — Ambulatory Visit (HOSPITAL_COMMUNITY)
Admission: AD | Admit: 2017-10-18 | Discharge: 2017-10-19 | Disposition: A | Discharge status: Home / Self Care | Payer: BLUE CROSS/BLUE SHIELD | Source: Ambulatory Visit | Attending: Obstetrics and Gynecology | Admitting: Obstetrics and Gynecology

## 2017-10-18 ENCOUNTER — Encounter (HOSPITAL_COMMUNITY): Payer: Self-pay | Admitting: Emergency Medicine

## 2017-10-18 DIAGNOSIS — F17200 Nicotine dependence, unspecified, uncomplicated: Secondary | ICD-10-CM | POA: Insufficient documentation

## 2017-10-18 DIAGNOSIS — O071 Delayed or excessive hemorrhage following failed attempted termination of pregnancy: Secondary | ICD-10-CM | POA: Diagnosis present

## 2017-10-18 DIAGNOSIS — O034 Incomplete spontaneous abortion without complication: Secondary | ICD-10-CM

## 2017-10-18 LAB — URINALYSIS, ROUTINE W REFLEX MICROSCOPIC
BILIRUBIN URINE: NEGATIVE
Glucose, UA: NEGATIVE mg/dL
Ketones, ur: NEGATIVE mg/dL
LEUKOCYTES UA: NEGATIVE
Nitrite: NEGATIVE
PROTEIN: 30 mg/dL — AB
SPECIFIC GRAVITY, URINE: 1.017 (ref 1.005–1.030)
pH: 7 (ref 5.0–8.0)

## 2017-10-18 LAB — CBC WITH DIFFERENTIAL/PLATELET
Abs Immature Granulocytes: 0 10*3/uL (ref 0.0–0.1)
Basophils Absolute: 0 10*3/uL (ref 0.0–0.1)
Basophils Relative: 0 %
EOS ABS: 0.1 10*3/uL (ref 0.0–0.7)
EOS PCT: 1 %
HEMATOCRIT: 35.7 % — AB (ref 36.0–46.0)
Hemoglobin: 12.2 g/dL (ref 12.0–15.0)
IMMATURE GRANULOCYTES: 0 %
LYMPHS ABS: 0.9 10*3/uL (ref 0.7–4.0)
Lymphocytes Relative: 9 %
MCH: 31.4 pg (ref 26.0–34.0)
MCHC: 34.2 g/dL (ref 30.0–36.0)
MCV: 92 fL (ref 78.0–100.0)
MONOS PCT: 7 %
Monocytes Absolute: 0.6 10*3/uL (ref 0.1–1.0)
Neutro Abs: 8 10*3/uL — ABNORMAL HIGH (ref 1.7–7.7)
Neutrophils Relative %: 83 %
Platelets: 279 10*3/uL (ref 150–400)
RBC: 3.88 MIL/uL (ref 3.87–5.11)
RDW: 12.2 % (ref 11.5–15.5)
WBC: 9.6 10*3/uL (ref 4.0–10.5)

## 2017-10-18 LAB — COMPREHENSIVE METABOLIC PANEL
ALBUMIN: 4.7 g/dL (ref 3.5–5.0)
ALT: 9 U/L — AB (ref 14–54)
AST: 15 U/L (ref 15–41)
Alkaline Phosphatase: 62 U/L (ref 38–126)
Anion gap: 11 (ref 5–15)
BUN: 7 mg/dL (ref 6–20)
CHLORIDE: 103 mmol/L (ref 101–111)
CO2: 24 mmol/L (ref 22–32)
CREATININE: 0.79 mg/dL (ref 0.44–1.00)
Calcium: 9.7 mg/dL (ref 8.9–10.3)
GFR calc Af Amer: >60 mL/min (ref >60)
GFR calc non Af Amer: >60 mL/min (ref >60)
Glucose, Bld: 86 mg/dL (ref 65–99)
Potassium: 3.6 mmol/L (ref 3.5–5.1)
Sodium: 138 mmol/L (ref 135–145)
Total Bilirubin: 0.6 mg/dL (ref 0.3–1.2)
Total Protein: 7.2 g/dL (ref 6.5–8.1)

## 2017-10-18 LAB — I-STAT CG4 LACTIC ACID, ED
LACTIC ACID, VENOUS: 1.09 mmol/L (ref 0.5–1.9)
LACTIC ACID, VENOUS: 0.65 mmol/L (ref 0.5–1.9)

## 2017-10-18 LAB — HCG, QUANTITATIVE, PREGNANCY: HCG, BETA CHAIN, QUANT, S: 21 m[IU]/mL — AB (ref <5)

## 2017-10-18 MED ORDER — SODIUM CHLORIDE 0.9 % IV BOLUS (SEPSIS)
1000.0000 mL | Freq: Once | INTRAVENOUS | Status: AC
  Administered 2017-10-18: 1000 mL via INTRAVENOUS

## 2017-10-18 MED ORDER — IBUPROFEN 800 MG PO TABS
800.0000 mg | ORAL_TABLET | Freq: Once | ORAL | Status: AC
  Administered 2017-10-18: 800 mg via ORAL
  Filled 2017-10-18: qty 1

## 2017-10-18 MED ORDER — SODIUM CHLORIDE 0.9 % IV SOLN
2.0000 g | Freq: Once | INTRAVENOUS | Status: AC
  Administered 2017-10-18: 2 g via INTRAVENOUS
  Filled 2017-10-18: qty 20

## 2017-10-18 MED ORDER — ONDANSETRON HCL 4 MG/2ML IJ SOLN
4.0000 mg | Freq: Once | INTRAMUSCULAR | Status: AC
  Administered 2017-10-18: 4 mg via INTRAVENOUS
  Filled 2017-10-18: qty 2

## 2017-10-18 MED ORDER — ACETAMINOPHEN 500 MG PO TABS
1000.0000 mg | ORAL_TABLET | Freq: Once | ORAL | Status: AC
  Administered 2017-10-19: 1000 mg via ORAL
  Filled 2017-10-18: qty 2

## 2017-10-18 NOTE — ED Provider Notes (Signed)
[facility] EMERGENCY DEPARTMENT Provider Note   CSN: [id number] Arrival date & time: 10/18/17  2032     History   Chief Complaint Chief Complaint  Patient presents with  . Emesis  . Fever    HPI Andrea Foley is a 22 y.o. female.  Patient presents to the ED with complaint of nausea, vomiting and fever that started this morning. She has a recent history of induced abortion (09/10/17 with Cytotec, [facility]). On 10/10/17, she was seen in this ED for evaluation of nausea/vomiting, vaginal bleeding and cramping and found to have POC in vaginal vault on pelvic exam that was removed, and an [place] that was c/w blood clot but no retained products in uterus. She went to her OB/GYN yesterday and reports an [place] that showed retained products and was given a second Cytotec which she has not taken yet. She reports that her nausea and vomiting has been continuous through the week, her bleeding is improving, and she currently has very little discomfort, but woke today with fever of 103 and worse nausea/vomiting today. No urinary symptoms, CP, SoB.   The history is provided by the patient. No language interpreter was used.    Past Medical History:  Diagnosis Date  . 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 Seasonal allergies 09/22/2013    Past Surgical History:  Procedure Laterality Date  . WISDOM TOOTH EXTRACTION       OB History   None      Home Medications    Prior to Admission medications   Medication Sig Start Date End Date Taking? Authorizing Provider  azithromycin (ZITHROMAX) 250 MG tablet As packaged Patient not taking: Reported on 10/18/2017 05/01/15   Tonye Pearson, MD  benzonatate (TESSALON) 100 MG capsule Take 1-2 capsules (100-200 mg total) by mouth 3 (three) times daily as needed for cough. Patient not taking: Reported on 10/18/2017 09/13/14   Porfirio Oar, PA-C  Guaifenesin [facility] MAXIMUM  STRENGTH) 1200 MG TB12 Take 1 tablet (1,200 mg total) by mouth every 12 (twelve) hours as needed. Patient not taking: Reported on 10/18/2017 09/13/14   Porfirio Oar, PA-C  ipratropium (ATROVENT) 0.03 % nasal spray Place 2 sprays into both nostrils 2 (two) times daily. Patient not taking: Reported on 10/18/2017 09/13/14   Porfirio Oar, PA-C  ondansetron (ZOFRAN ODT) 4 MG disintegrating tablet Take 1 tablet (4 mg total) by mouth every 8 (eight) hours as needed for nausea or vomiting. Patient not taking: Reported on 10/18/2017 10/10/17   Kellie Shropshire, PA-C  promethazine (PHENERGAN) 25 MG tablet Take 1 tablet (25 mg total) by mouth every 6 (six) hours as needed for nausea or vomiting. Patient not taking: Reported on 10/18/2017 06/19/15   Doreene Eland, MD    Family History Family History  Problem Relation Age of Onset  . Hypertension Father   . Hyperlipidemia Father   . Diabetes Father     Social History Social History   Tobacco Use  . Smoking status: Never Smoker  . Smokeless tobacco: Never Used  Substance Use Topics  . Alcohol use: Yes    Alcohol/week: 0.0 oz    Comment: occ  . Drug use: Yes    Types: Marijuana     Allergies   Patient has no known allergies.   Review of Systems Review of Systems  Constitutional: Positive for chills and fever.  HENT: Negative.   Respiratory: Negative.  Cardiovascular: Negative.   Gastrointestinal: Positive for abdominal pain, nausea and vomiting.  Genitourinary: Positive for vaginal bleeding (improving). Negative for dysuria.  Musculoskeletal: Negative.   Neurological: Negative.      Physical Exam Updated Vital Signs BP 130/76 (BP Location: Right Arm)   Pulse (!) 117   Temp (!) 101.7 F (38.7 C) (Oral)   Resp 16   Ht  (1.676 m)   Wt 58.1 kg (128 lb)   SpO2 99%   BMI 20.66 kg/m   Physical Exam  Constitutional: She is oriented to person, place, and time. She appears well-developed and well-nourished.  HENT:    Head: Normocephalic.  Neck: Normal range of motion. Neck supple.  Cardiovascular: Normal rate and regular rhythm.  Pulmonary/Chest: Effort normal and breath sounds normal. She has no wheezes. She has no rales.  Abdominal: Soft. Bowel sounds are normal. There is tenderness (Mild suprapubic tenderness. ). There is no rebound and no guarding.  Genitourinary:  Genitourinary Comments: Small amount thin greenish vaginal discharge. No CMT. Bilateral adnexal tenderness without mass. No cervical bleeding.  Musculoskeletal: Normal range of motion.  Neurological: She is alert and oriented to person, place, and time.  Skin: Skin is warm and dry. No rash noted.  Psychiatric: She has a normal mood and affect.     ED Treatments / Results  Labs (all labs ordered are listed, but only abnormal results are displayed) Labs Reviewed  COMPREHENSIVE METABOLIC PANEL - Abnormal; Notable for the following components:      Result Value   ALT 9 (*)    All other components within normal limits  CBC WITH DIFFERENTIAL/PLATELET - Abnormal; Notable for the following components:   HCT 35.7 (*)    Neutro Abs 8.0 (*)    All other components within normal limits  URINALYSIS, ROUTINE W REFLEX MICROSCOPIC - Abnormal; Notable for the following components:   Hgb urine dipstick SMALL (*)    Protein, ur 30 (*)    Bacteria, UA RARE (*)    All other components within normal limits  HCG, QUANTITATIVE, PREGNANCY - Abnormal; Notable for the following components:   hCG, Beta Chain, Quant, S 21 (*)    All other components within normal limits  CULTURE, BLOOD (ROUTINE X 2)  CULTURE, BLOOD (ROUTINE X 2)  I-STAT CG4 LACTIC ACID, ED  I-STAT CG4 LACTIC ACID, ED   Results for orders placed or performed during the hospital encounter of 10/18/17  Wet prep, genital  Result Value Ref Range   Yeast Wet Prep HPF POC NONE SEEN NONE SEEN   Trich, Wet Prep NONE SEEN NONE SEEN   Clue Cells Wet Prep HPF POC PRESENT (A) NONE SEEN   WBC,  Wet Prep HPF POC MANY (A) NONE SEEN   Sperm NONE SEEN   Comprehensive metabolic panel  Result Value Ref Range   Sodium 138 135 - 145 mmol/L   Potassium 3.6 3.5 - 5.1 mmol/L   Chloride 103 101 - 111 mmol/L   CO2 24 22 - 32 mmol/L   Glucose, Bld 86 65 - 99 mg/dL   BUN 7 6 - 20 mg/dL   Creatinine, Ser 1.61 0.44 - 1.00 mg/dL   Calcium 9.7 8.9 - 09.6 mg/dL   Total Protein 7.2 6.5 - 8.1 g/dL   Albumin 4.7 3.5 - 5.0 g/dL   AST 15 15 - 41 U/L   ALT 9 (L) 14 - 54 U/L   Alkaline Phosphatase 62 38 - 126 U/L   Total Bilirubin  0.6 0.3 - 1.2 mg/dL   GFR calc non Af Amer >60 >60 mL/min   GFR calc Af Amer >60 >60 mL/min   Anion gap 11 5 - 15  CBC WITH DIFFERENTIAL  Result Value Ref Range   WBC 9.6 4.0 - 10.5 K/uL   RBC 3.88 3.87 - 5.11 MIL/uL   Hemoglobin 12.2 12.0 - 15.0 g/dL   HCT 65.7 (L) 84.6 - 96.2 %   MCV 92.0 78.0 - 100.0 fL   MCH 31.4 26.0 - 34.0 pg   MCHC 34.2 30.0 - 36.0 g/dL   RDW 95.2 84.1 - 32.4 %   Platelets 279 150 - 400 K/uL   Neutrophils Relative % 83 %   Neutro Abs 8.0 (H) 1.7 - 7.7 K/uL   Lymphocytes Relative 9 %   Lymphs Abs 0.9 0.7 - 4.0 K/uL   Monocytes Relative 7 %   Monocytes Absolute 0.6 0.1 - 1.0 K/uL   Eosinophils Relative 1 %   Eosinophils Absolute 0.1 0.0 - 0.7 K/uL   Basophils Relative 0 %   Basophils Absolute 0.0 0.0 - 0.1 K/uL   Immature Granulocytes 0 %   Abs Immature Granulocytes 0.0 0.0 - 0.1 K/uL  Urinalysis, Routine w reflex microscopic  Result Value Ref Range   Color, Urine YELLOW YELLOW   APPearance CLEAR CLEAR   Specific Gravity, Urine 1.017 1.005 - 1.030   pH 7.0 5.0 - 8.0   Glucose, UA NEGATIVE NEGATIVE mg/dL   Hgb urine dipstick SMALL (A) NEGATIVE   Bilirubin Urine NEGATIVE NEGATIVE   Ketones, ur NEGATIVE NEGATIVE mg/dL   Protein, ur 30 (A) NEGATIVE mg/dL   Nitrite NEGATIVE NEGATIVE   Leukocytes, UA NEGATIVE NEGATIVE   RBC / HPF 0-5 0 - 5 RBC/hpf   WBC, UA 0-5 0 - 5 WBC/hpf   Bacteria, UA RARE (A) NONE SEEN   Squamous Epithelial /  LPF 0-5 0 - 5   Mucus PRESENT   hCG, quantitative, pregnancy  Result Value Ref Range   hCG, Beta Chain, Quant, S 21 (H) <5 mIU/mL  I-Stat CG4 Lactic Acid, ED  (not at  [facility])  Result Value Ref Range   Lactic Acid, Venous 1.09 0.5 - 1.9 mmol/L  I-Stat CG4 Lactic Acid, ED  (not at  [facility])  Result Value Ref Range   Lactic Acid, Venous 0.65 0.5 - 1.9 mmol/L    EKG None  Radiology No results found.  Procedures Procedures (including critical care time)  Medications Ordered in ED Medications  cefTRIAXone (ROCEPHIN) 2 g in sodium chloride 0.9 % 100 mL IVPB (2 g Intravenous New Bag/Given 10/18/17 2245)  sodium chloride 0.9 % bolus 1,000 mL (0 mLs Intravenous Stopped 10/18/17 2154)    And  sodium chloride 0.9 % bolus 1,000 mL (0 mLs Intravenous Stopped 10/18/17 2250)  ibuprofen (ADVIL,MOTRIN) tablet 800 mg (800 mg Oral Given 10/18/17 2121)  ondansetron (ZOFRAN) injection 4 mg (4 mg Intravenous Given 10/18/17 2119)     Initial Impression / Assessment and Plan / ED Course  I have reviewed the triage vital signs and the nursing notes.  Pertinent labs & imaging results that were available during my care of the patient were reviewed by me and considered in my medical decision making (see chart for details).     Patient here with fever that started this morning, and nausea and vomiting that has been persistent since last week. Recent history of induced abortion, question retained products on [place] yesterday at OB/GYN follow up.  Discussed with Dr. Jolayne Panther at [facility] who advised repeat US to confirm or exclude retained POC is reasonable. Will contact her with results. In the interim, pelvic exam completed and labs obtained. No bleeding, visualized POC in cervix, no significant uterine tenderness.   [place] c/w retained products. Dr. Jolayne Panther accepts the patient for transfer to [facility] for further care. Patient and family updated.   Final Clinical Impressions(s) / ED Diagnoses   Final diagnoses:    None   1. Incomplete abortion  ED Discharge Orders    None       Elpidio Anis, PA-C 10/19/17 1610    Gilda Crease, MD 10/20/17 0330

## 2017-10-18 NOTE — ED Triage Notes (Signed)
Pt had abortion on 4/8 by medication.  This am pt started to have elevated temp with nausea and vomiting

## 2017-10-18 NOTE — ED Provider Notes (Signed)
Patient placed in Quick Look pathway, seen and evaluated   Chief Complaint: Abdominal pain, fever  HPI:   Andrea Foley is a 22 y.o. female who presents to ED for fever which began this morning. Associated with nausea and mild lower abdominal pain.  Patient underwent an elective medical abortion on 4/08.  About 3 weeks later, she began having heavy bleeding and cramping.  She went to the emergency department where hcg and ultrasound were obtained. Ultrasound at that point was read as showing no retained products; hcg around 1200.  She reports that her bleeding has continued, but lightened.  She received ultrasound at GYN office yesterday which showed possible products of conception versus blood clot.  Repeat hCG was 27.  Plan was to repeat Cytotec course and start OCPs.  She has not taken her Cytotec yet.  She woke up this morning with a fever up to 103 at home.  She took Tylenol which brought it down a little, but was still febrile to 101.   ROS: + fever, nausea; -cough, congestion, shortness of breath (one)  Physical Exam:   Gen: No distress  Neuro: Awake and Alert  Skin: Warm    Focused Exam: Mild diffuse tenderness across lower abdomen. No rebound or guarding. No CVA tenderness. Tachycardic, but regular. Lungs CTA bilaterally.    Given febrile/tachycardic with ultrasound at [facility] yesterday with possible retained products, code sepsis initiated. Fluids and labs ordered. Spoke with nursing staff and recommended patient get next bed available.   Initiation of care has begun. The patient has been counseled on the process, plan, and necessity for staying for the completion/evaluation, and the remainder of the medical screening examination    Arby Dahir, Chase Picket, PA-C 10/18/17 2109    Little, Ambrose Finland, MD 10/19/17 1309

## 2017-10-19 ENCOUNTER — Inpatient Hospital Stay (HOSPITAL_COMMUNITY): Payer: BLUE CROSS/BLUE SHIELD | Admitting: Anesthesiology

## 2017-10-19 ENCOUNTER — Emergency Department (HOSPITAL_COMMUNITY): Payer: BLUE CROSS/BLUE SHIELD

## 2017-10-19 ENCOUNTER — Encounter (HOSPITAL_COMMUNITY)
Admission: AD | Disposition: A | Discharge status: Home / Self Care | Payer: Self-pay | Source: Ambulatory Visit | Attending: Emergency Medicine

## 2017-10-19 DIAGNOSIS — O034 Incomplete spontaneous abortion without complication: Secondary | ICD-10-CM

## 2017-10-19 DIAGNOSIS — F17200 Nicotine dependence, unspecified, uncomplicated: Secondary | ICD-10-CM | POA: Diagnosis not present

## 2017-10-19 DIAGNOSIS — O071 Delayed or excessive hemorrhage following failed attempted termination of pregnancy: Secondary | ICD-10-CM | POA: Diagnosis present

## 2017-10-19 HISTORY — PX: DILATION AND EVACUATION: SHX1459

## 2017-10-19 LAB — GC/CHLAMYDIA PROBE AMP (~~LOC~~) NOT AT ARMC
CHLAMYDIA, DNA PROBE: NEGATIVE
NEISSERIA GONORRHEA: NEGATIVE

## 2017-10-19 LAB — WET PREP, GENITAL
Sperm: NONE SEEN
Trich, Wet Prep: NONE SEEN
Yeast Wet Prep HPF POC: NONE SEEN

## 2017-10-19 SURGERY — DILATION AND EVACUATION, UTERUS
Anesthesia: General | Site: Vagina

## 2017-10-19 MED ORDER — FENTANYL CITRATE (PF) 100 MCG/2ML IJ SOLN
INTRAMUSCULAR | Status: AC
  Filled 2017-10-19: qty 2

## 2017-10-19 MED ORDER — DEXTROSE 5 % IV SOLN
200.0000 mg | INTRAVENOUS | Status: AC
  Administered 2017-10-19: 200 mg via INTRAVENOUS
  Filled 2017-10-19 (×2): qty 200

## 2017-10-19 MED ORDER — MIDAZOLAM HCL 2 MG/2ML IJ SOLN
INTRAMUSCULAR | Status: DC | PRN
  Administered 2017-10-19: 2 mg via INTRAVENOUS

## 2017-10-19 MED ORDER — OXYCODONE-ACETAMINOPHEN 5-325 MG PO TABS: 1.0000 | ORAL_TABLET | Freq: Four times a day (QID) | ORAL | 0 refills | Status: DC | PRN

## 2017-10-19 MED ORDER — ONDANSETRON HCL 4 MG/2ML IJ SOLN
INTRAMUSCULAR | Status: DC | PRN
  Administered 2017-10-19: 4 mg via INTRAVENOUS

## 2017-10-19 MED ORDER — FENTANYL CITRATE (PF) 100 MCG/2ML IJ SOLN
25.0000 ug | INTRAMUSCULAR | Status: DC | PRN
  Administered 2017-10-19: 50 ug via INTRAVENOUS

## 2017-10-19 MED ORDER — FENTANYL CITRATE (PF) 250 MCG/5ML IJ SOLN
INTRAMUSCULAR | Status: AC
  Filled 2017-10-19: qty 5

## 2017-10-19 MED ORDER — KETOROLAC TROMETHAMINE 30 MG/ML IJ SOLN
INTRAMUSCULAR | Status: DC | PRN
  Administered 2017-10-19: 30 mg via INTRAVENOUS

## 2017-10-19 MED ORDER — LIDOCAINE HCL (CARDIAC) PF 100 MG/5ML IV SOSY
PREFILLED_SYRINGE | INTRAVENOUS | Status: DC | PRN
  Administered 2017-10-19: 80 mg via INTRAVENOUS

## 2017-10-19 MED ORDER — PROPOFOL 10 MG/ML IV BOLUS
INTRAVENOUS | Status: DC | PRN
  Administered 2017-10-19: 200 mg via INTRAVENOUS

## 2017-10-19 MED ORDER — SCOPOLAMINE 1 MG/3DAYS TD PT72
MEDICATED_PATCH | TRANSDERMAL | Status: DC | PRN
  Administered 2017-10-19: 1 via TRANSDERMAL

## 2017-10-19 MED ORDER — CHLOROPROCAINE HCL 1 % IJ SOLN
INTRAMUSCULAR | Status: AC
  Filled 2017-10-19: qty 30

## 2017-10-19 MED ORDER — MEPERIDINE HCL 25 MG/ML IJ SOLN: 6.2500 mg | INTRAMUSCULAR | Status: DC | PRN

## 2017-10-19 MED ORDER — CHLOROPROCAINE HCL 1 % IJ SOLN
INTRAMUSCULAR | Status: DC | PRN
  Administered 2017-10-19: 10 mL

## 2017-10-19 MED ORDER — ONDANSETRON HCL 4 MG/2ML IJ SOLN
INTRAMUSCULAR | Status: AC
  Filled 2017-10-19: qty 2

## 2017-10-19 MED ORDER — LACTATED RINGERS IV SOLN
INTRAVENOUS | Status: DC | PRN
  Administered 2017-10-19 (×2): via INTRAVENOUS

## 2017-10-19 MED ORDER — MIDAZOLAM HCL 2 MG/2ML IJ SOLN
INTRAMUSCULAR | Status: AC
  Filled 2017-10-19: qty 2

## 2017-10-19 MED ORDER — PROMETHAZINE HCL 25 MG/ML IJ SOLN
INTRAMUSCULAR | Status: AC
  Filled 2017-10-19: qty 1

## 2017-10-19 MED ORDER — SCOPOLAMINE 1 MG/3DAYS TD PT72: 1.0000 | MEDICATED_PATCH | Freq: Once | TRANSDERMAL | Status: DC

## 2017-10-19 MED ORDER — PROMETHAZINE HCL 25 MG/ML IJ SOLN
6.2500 mg | INTRAMUSCULAR | Status: DC | PRN
  Administered 2017-10-19: 6.25 mg via INTRAVENOUS

## 2017-10-19 MED ORDER — SUCCINYLCHOLINE CHLORIDE 200 MG/10ML IV SOSY
PREFILLED_SYRINGE | INTRAVENOUS | Status: AC
  Filled 2017-10-19: qty 10

## 2017-10-19 MED ORDER — SUCCINYLCHOLINE CHLORIDE 20 MG/ML IJ SOLN
INTRAMUSCULAR | Status: DC | PRN
  Administered 2017-10-19: 100 mg via INTRAVENOUS

## 2017-10-19 MED ORDER — PROPOFOL 10 MG/ML IV BOLUS
INTRAVENOUS | Status: AC
  Filled 2017-10-19: qty 20

## 2017-10-19 MED ORDER — SCOPOLAMINE 1 MG/3DAYS TD PT72
MEDICATED_PATCH | TRANSDERMAL | Status: AC
  Filled 2017-10-19: qty 1

## 2017-10-19 MED ORDER — DEXAMETHASONE SODIUM PHOSPHATE 4 MG/ML IJ SOLN
INTRAMUSCULAR | Status: DC | PRN
  Administered 2017-10-19: 4 mg via INTRAVENOUS

## 2017-10-19 MED ORDER — LIDOCAINE HCL (CARDIAC) PF 100 MG/5ML IV SOSY
PREFILLED_SYRINGE | INTRAVENOUS | Status: AC
  Filled 2017-10-19: qty 5

## 2017-10-19 MED ORDER — DEXAMETHASONE SODIUM PHOSPHATE 4 MG/ML IJ SOLN
INTRAMUSCULAR | Status: AC
  Filled 2017-10-19: qty 1

## 2017-10-19 MED ORDER — DOXYCYCLINE HYCLATE 100 MG PO CAPS: 100.0000 mg | ORAL_CAPSULE | Freq: Two times a day (BID) | ORAL | 0 refills | Status: DC

## 2017-10-19 MED ORDER — MIDAZOLAM HCL 2 MG/2ML IJ SOLN: 0.5000 mg | Freq: Once | INTRAMUSCULAR | Status: DC | PRN

## 2017-10-19 MED ORDER — FENTANYL CITRATE (PF) 100 MCG/2ML IJ SOLN
INTRAMUSCULAR | Status: DC | PRN
  Administered 2017-10-19 (×4): 50 ug via INTRAVENOUS

## 2017-10-19 MED ORDER — HYDROMORPHONE HCL 2 MG/ML IJ SOLN
0.5000 mg | Freq: Once | INTRAMUSCULAR | Status: AC
  Administered 2017-10-19: 0.5 mg via INTRAVENOUS
  Filled 2017-10-19: qty 1

## 2017-10-19 MED ORDER — IBUPROFEN 600 MG PO TABS: 600.0000 mg | ORAL_TABLET | Freq: Four times a day (QID) | ORAL | 3 refills | Status: DC | PRN

## 2017-10-19 SURGICAL SUPPLY — 19 items
CATH ROBINSON RED A/P 16FR (CATHETERS) ×3 IMPLANT
DECANTER SPIKE VIAL GLASS SM (MISCELLANEOUS) ×3 IMPLANT
GLOVE BIOGEL PI IND STRL 6.5 (GLOVE) ×1 IMPLANT
GLOVE BIOGEL PI IND STRL 7.0 (GLOVE) ×1 IMPLANT
GLOVE BIOGEL PI INDICATOR 6.5 (GLOVE) ×2
GLOVE BIOGEL PI INDICATOR 7.0 (GLOVE) ×2
GLOVE SURG SS PI 6.0 STRL IVOR (GLOVE) ×3 IMPLANT
GOWN STRL REUS W/TWL LRG LVL3 (GOWN DISPOSABLE) ×6 IMPLANT
KIT BERKELEY 1ST TRIMESTER 3/8 (MISCELLANEOUS) ×3 IMPLANT
NS IRRIG 1000ML POUR BTL (IV SOLUTION) ×3 IMPLANT
PACK VAGINAL MINOR WOMEN LF (CUSTOM PROCEDURE TRAY) ×3 IMPLANT
PAD OB MATERNITY 4.3X12.25 (PERSONAL CARE ITEMS) ×3 IMPLANT
PAD PREP 24X48 CUFFED NSTRL (MISCELLANEOUS) ×3 IMPLANT
SET BERKELEY SUCTION TUBING (SUCTIONS) ×3 IMPLANT
TOWEL OR 17X24 6PK STRL BLUE (TOWEL DISPOSABLE) ×6 IMPLANT
VACURETTE 10 RIGID CVD (CANNULA) IMPLANT
VACURETTE 7MM CVD STRL WRAP (CANNULA) ×3 IMPLANT
VACURETTE 8 RIGID CVD (CANNULA) IMPLANT
VACURETTE 9 RIGID CVD (CANNULA) IMPLANT

## 2017-10-19 NOTE — Transfer of Care (Signed)
Immediate Anesthesia Transfer of Care Note  Patient: Andrea Foley  Procedure(s) Performed: DILATATION AND EVACUATION (N/A Vagina )  Patient Location: PACU  Anesthesia Type:General  Level of Consciousness: awake, alert  and oriented  Airway & Oxygen Therapy: Patient Spontanous Breathing  Post-op Assessment: Report given to RN and Post -op Vital signs reviewed and stable  Post vital signs: Reviewed and stable HR 84, RR 16, BP 115/60, SaO2 100%  Last Vitals:  Vitals Value Taken Time  BP 115/60 10/19/2017  5:30 AM  Temp    Pulse 83 10/19/2017  5:33 AM  Resp 14 10/19/2017  5:33 AM  SpO2 100 % 10/19/2017  5:33 AM  Vitals shown include unvalidated device data.  Last Pain:  Vitals:   10/19/17 0359  TempSrc:   PainSc: 2          Complications: No apparent anesthesia complications

## 2017-10-19 NOTE — ED Notes (Signed)
Report called to MAU, pt transferring to MAU for incomplete abortion. [facility] notified to transport.

## 2017-10-19 NOTE — Anesthesia Preprocedure Evaluation (Signed)
Anesthesia Evaluation  Patient identified by MRN, date of birth, ID band Patient awake    Reviewed: Allergy & Precautions, NPO status , Patient's Chart, lab work & pertinent test results  History of Anesthesia Complications Negative for: history of anesthetic complications  Airway Mallampati: I  TM Distance: >3 FB Neck ROM: Full    Dental  (+) Dental Advisory Given   Pulmonary Current Smoker,    breath sounds clear to auscultation       Cardiovascular negative cardio ROS   Rhythm:Regular Rate:Normal     Neuro/Psych negative neurological ROS     GI/Hepatic Neg liver ROS, Nausea and vomiting   Endo/Other  negative endocrine ROS  Renal/GU negative Renal ROS     Musculoskeletal   Abdominal   Peds  Hematology negative hematology ROS (+)   Anesthesia Other Findings   Reproductive/Obstetrics Recent termination, retained products of conception                             Anesthesia Physical Anesthesia Plan  ASA: II and emergent  Anesthesia Plan: General   Post-op Pain Management:    Induction: Intravenous and Rapid sequence  PONV Risk Score and Plan: 2 and Ondansetron, Dexamethasone and Scopolamine patch - Pre-op  Airway Management Planned: Oral ETT  Additional Equipment:   Intra-op Plan:   Post-operative Plan: Extubation in OR  Informed Consent: I have reviewed the patients History and Physical, chart, labs and discussed the procedure including the risks, benefits and alternatives for the proposed anesthesia with the patient or authorized representative who has indicated his/her understanding and acceptance.   Dental advisory given  Plan Discussed with: CRNA and Surgeon  Anesthesia Plan Comments: (Plan routine monitors, GETA)        Anesthesia Quick Evaluation

## 2017-10-19 NOTE — Anesthesia Postprocedure Evaluation (Signed)
Anesthesia Post Note  Patient: Manaal Mandala  Procedure(s) Performed: DILATATION AND EVACUATION (N/A Vagina )     Patient location during evaluation: PACU Anesthesia Type: General Level of consciousness: awake and alert, oriented and patient cooperative Pain management: pain level controlled Vital Signs Assessment: post-procedure vital signs reviewed and stable Respiratory status: spontaneous breathing, nonlabored ventilation and respiratory function stable Cardiovascular status: blood pressure returned to baseline and stable Postop Assessment: no apparent nausea or vomiting Anesthetic complications: no    Last Vitals:  Vitals:   10/19/17 0615 10/19/17 0645  BP: (!) 105/59 106/60  Pulse: 65 60  Resp: 15 15  Temp:  36.7 C  SpO2: 98% 100%    Last Pain:  Vitals:   10/19/17 0645  TempSrc:   PainSc: 2    Pain Goal:                 Jerrik Housholder,E. Vrinda Heckstall

## 2017-10-19 NOTE — Anesthesia Procedure Notes (Signed)
Procedure Name: Intubation Date/Time: 10/19/2017 4:52 AM Performed by: Elenore Paddy, CRNA Pre-anesthesia Checklist: Patient identified, Emergency Drugs available, Suction available, Patient being monitored and Timeout performed Patient Re-evaluated:Patient Re-evaluated prior to induction Oxygen Delivery Method: Circle system utilized Preoxygenation: Pre-oxygenation with 100% oxygen Induction Type: IV induction, Rapid sequence and Inhalational induction with existing ETT Laryngoscope Size: Mac and 3 Grade View: Grade I Tube size: 7.0 mm Number of attempts: 1 Airway Equipment and Method: Stylet Placement Confirmation: ETT inserted through vocal cords under direct vision,  positive ETCO2 and breath sounds checked- equal and bilateral Secured at: 21 cm Tube secured with: Tape Dental Injury: Teeth and Oropharynx as per pre-operative assessment

## 2017-10-19 NOTE — Discharge Instructions (Addendum)
Dilation and Curettage or Vacuum Curettage Dilation and curettage (D&C) and vacuum curettage are minor procedures. A D&C involves stretching (dilation) the cervix and scraping (curettage) the inside lining of the uterus (endometrium). During a D&C, tissue is gently scraped from the endometrium, starting from the top portion of the uterus down to the lowest part of the uterus (cervix). During a vacuum curettage, the lining and tissue in the uterus are removed with the use of gentle suction. Curettage may be performed to either diagnose or treat a problem. As a diagnostic procedure, curettage is performed to examine tissues from the uterus. A diagnostic curettage may be done if you have:  Irregular bleeding in the uterus.  Bleeding with the development of clots.  Spotting between menstrual periods.  Prolonged menstrual periods or other abnormal bleeding.  Bleeding after menopause.  No menstrual period (amenorrhea).  A change in size and shape of the uterus.  Abnormal endometrial cells discovered during a Pap test.  As a treatment procedure, curettage may be performed for the following reasons:  Removal of an IUD (intrauterine device).  Removal of retained placenta after giving birth.  Abortion.  Miscarriage.  Removal of endometrial polyps.  Removal of uncommon types of noncancerous lumps (fibroids).  Tell a health care provider about:  Any allergies you have, including allergies to prescribed medicine or latex.  All medicines you are taking, including vitamins, herbs, eye drops, creams, and over-the-counter medicines. This is especially important if you take any blood-thinning medicine. Bring a list of all of your medicines to your appointment.  Any problems you or family members have had with anesthetic medicines.  Any blood disorders you have.  Any surgeries you have had.  Your medical history and any medical conditions you have.  Whether you are pregnant or may be  pregnant.  Recent vaginal infections you have had.  Recent menstrual periods, bleeding problems you have had, and what form of birth control (contraception) you use. What are the risks? Generally, this is a safe procedure. However, problems may occur, including:  Infection.  Heavy vaginal bleeding.  Allergic reactions to medicines.  Damage to the cervix or other structures or organs.  Development of scar tissue (adhesions) inside the uterus, which can cause abnormal amounts of menstrual bleeding. This may make it harder to get pregnant in the future.  A hole (perforation) or puncture in the uterine wall. This is rare.  What happens before the procedure? Staying hydrated Follow instructions from your health care provider about hydration, which may include:  Up to 2 hours before the procedure - you may continue to drink clear liquids, such as water, clear fruit juice, black coffee, and plain tea.  Eating and drinking restrictions Follow instructions from your health care provider about eating and drinking, which may include:  8 hours before the procedure - stop eating heavy meals or foods such as meat, fried foods, or fatty foods.  6 hours before the procedure - stop eating light meals or foods, such as toast or cereal.  6 hours before the procedure - stop drinking milk or drinks that contain milk.  2 hours before the procedure - stop drinking clear liquids. If your health care provider told you to take your medicine(s) on the day of your procedure, take them with only a sip of water.  Medicines  Ask your health care provider about: ? Changing or stopping your regular medicines. This is especially important if you are taking diabetes medicines or blood thinners. ? Taking   medicines such as aspirin and ibuprofen. These medicines can thin your blood. Do not take these medicines before your procedure if your health care provider instructs you not to.  You may be given antibiotic  medicine to help prevent infection. General instructions  For 24 hours before your procedure, do not: ? Douche. ? Use tampons. ? Use medicines, creams, or suppositories in the vagina. ? Have sexual intercourse.  You may be given a pregnancy test on the day of the procedure.  Plan to have someone take you home from the hospital or clinic.  You may have a blood or urine sample taken.  If you will be going home right after the procedure, plan to have someone with you for 24 hours. What happens during the procedure?  To reduce your risk of infection: ? Your health care team will wash or sanitize their hands. ? Your skin will be washed with soap.  An IV tube will be inserted into one of your veins.  You will be given one of the following: ? A medicine that numbs the area in and around the cervix (local anesthetic). ? A medicine to make you fall asleep (general anesthetic).  You will lie down on your back, with your feet in foot rests (stirrups).  The size and position of your uterus will be checked.  A lubricated instrument (speculum or Sims retractor) will be inserted into the back side of your vagina. The speculum will be used to hold apart the walls of your vagina so your health care provider can see your cervix.  A tool (tenaculum) will be attached to the lip of the cervix to stabilize it.  Your cervix will be softened and dilated. This may be done by: ? Taking a medicine. ? Having tapered dilators or thin rods (laminaria) or gradual widening instruments (tapered dilators) inserted into your cervix.  A small, sharp, curved instrument (curette) will be used to scrape a small amount of tissue or cells from the endometrium or cervical canal. In some cases, gentle suction is applied with the curette. The curette will then be removed. The cells will be taken to a lab for testing. The procedure may vary among health care providers and hospitals. What happens after the  procedure?  You may have mild cramping, backache, pain, and light bleeding or spotting. You may pass small blood clots from your vagina.  You may have to wear compression stockings. These stockings help to prevent blood clots and reduce swelling in your legs.  Your blood pressure, heart rate, breathing rate, and blood oxygen level will be monitored until the medicines you were given have worn off. Summary  Dilation and curettage (D&C) involves stretching (dilation) the cervix and scraping (curettage) the inside lining of the uterus (endometrium).  After the procedure, you may have mild cramping, backache, pain, and light bleeding or spotting. You may pass small blood clots from your vagina.  Plan to have someone take you home from the hospital or clinic. This information is not intended to replace advice given to you by your health care provider. Make sure you discuss any questions you have with your health care provider. Document Released: 05/22/2005 Document Revised: 02/06/2016 Document Reviewed: 02/06/2016 Elsevier Interactive Patient Education  [employer].  Patient excused from work on 5/17.

## 2017-10-19 NOTE — Op Note (Signed)
Ulla Gallo PROCEDURE DATE: 10/18/2017 - 10/19/2017  PREOPERATIVE DIAGNOSIS: retained products of conception POSTOPERATIVE DIAGNOSIS: The same. PROCEDURE:     Dilation and Evacuation. SURGEON:  Dr. Catalina Antigua  INDICATIONS: 22 y.o. G1P0010 with retained products of conception and fever, needing surgical completion.  Risks of surgery were discussed with the patient including but not limited to: bleeding which may require transfusion; infection which may require antibiotics; injury to uterus or surrounding organs;need for additional procedures including laparotomy or laparoscopy; possibility of intrauterine scarring which may impair future fertility; and other postoperative/anesthesia complications. Written informed consent was obtained.    FINDINGS:  An 8-week size anteverted uterus, moderate amounts of products of conception, specimen sent to pathology.  ANESTHESIA:    Monitored intravenous sedation, paracervical block. INTRAVENOUS FLUIDS:  800 ml of LR ESTIMATED BLOOD LOSS:  200 ml. SPECIMENS:  Products of conception sent to pathology COMPLICATIONS:  None immediate.  PROCEDURE DETAILS:  The patient received intravenous antibiotics while in the preoperative area.  She was then taken to the operating room where general anesthesia was administered and was found to be adequate.  After an adequate timeout was performed, she was placed in the dorsal lithotomy position and examined; then prepped and draped in the sterile manner.   Her bladder was catheterized for an unmeasured amount of clear, yellow urine. A vaginal speculum was then placed in the patient's vagina and a single tooth tenaculum was applied to the anterior lip of the cervix.  A paracervical block using 0.5% Marcaine was administered. The cervix was gently dilated to accommodate a 7 mm suction curette that was gently advanced to the uterine fundus.  The suction device was then activated and curette slowly rotated to clear the  uterus of products of conception.  A sharp curettage was then performed to confirm complete emptying of the uterus. There was minimal bleeding noted and the tenaculum removed with good hemostasis noted.   All instruments were removed from the patient's vagina. The patient tolerated the procedure well and was taken to the recovery area awake, and in stable condition.  The patient will be discharged to home as per PACU criteria.  Routine postoperative instructions given.  She was prescribed antibiotic, Percocet, Ibuprofen and Colace.  She will follow up in the clinic in 2 weeks for postoperative evaluation.

## 2017-10-19 NOTE — ED Notes (Signed)
Patient transported to Ultrasound 

## 2017-10-19 NOTE — H&P (Signed)
Andrea Foley is an 22 y.o. female G1P0010 s/p medical termination of pregnancy on 4/8 with retained products of conception here for dilatation and evacuation. Patient reports a normal course following her termination on 4/8 until a month later when she experience heavy vaginal bleeding. She was seen at [place] on 5/8 and ultrasound did not revealed retained products of conception. She reports some persistent light vaginal bleeding. She was seen by GYN on 5/15 where a diagnosis of retained POC was made with plan for repeat course of cytotec. She never took her repeated course of cytotec. She reports a fever as high as 103 at home with some minimal pelvic pain. Patient transferred from [place] with the diagnosis of retained POC for further management.     Past Medical History:  Diagnosis Date  . 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 WISDOM TOOTH EXTRACTION      Family History  Problem Relation Age of Onset  . Hypertension Father   . Hyperlipidemia Father   . Diabetes Father     Social History:  reports that she has never smoked. She has never used smokeless tobacco. She reports that she drinks alcohol. She reports that she has current or past drug history. Drug: Marijuana.  Allergies: No Known Allergies  Medications Prior to Admission  Medication Sig Dispense Refill Last Dose  . ondansetron (ZOFRAN ODT) 4 MG disintegrating tablet Take 1 tablet (4 mg total) by mouth every 8 (eight) hours as needed for nausea or vomiting. 20 tablet 0 Past Week at Unknown time  . azithromycin (ZITHROMAX) 250 MG tablet As packaged (Patient not taking: Reported on 10/18/2017) 6 tablet 0 Completed Course at Unknown time  . benzonatate (TESSALON) 100 MG capsule Take 1-2 capsules (100-200 mg total) by mouth 3 (three) times daily as needed for cough. (Patient not taking: Reported on 10/18/2017) 40 capsule 0 Completed Course at Unknown time  . Guaifenesin (MUCINEX  MAXIMUM STRENGTH) 1200 MG TB12 Take 1 tablet (1,200 mg total) by mouth every 12 (twelve) hours as needed. (Patient not taking: Reported on 10/18/2017) 14 tablet 1 Not Taking at Unknown time  . ipratropium (ATROVENT) 0.03 % nasal spray Place 2 sprays into both nostrils 2 (two) times daily. (Patient not taking: Reported on 10/18/2017) 30 mL 0 Not Taking at Unknown time  . promethazine (PHENERGAN) 25 MG tablet Take 1 tablet (25 mg total) by mouth every 6 (six) hours as needed for nausea or vomiting. (Patient not taking: Reported on 10/18/2017) 30 tablet 0 Completed Course at Unknown time    ROS See pertinent in HPI Blood pressure (!) 108/51, pulse 72, temperature 98.3 F (36.8 C), temperature source Oral, resp. rate 18, height 5' 5.5" (1.664 m), weight 128 lb (58.1 kg), SpO2 100 %. Physical Exam GENERAL: Well-developed, well-nourished female in no acute distress.  LUNGS: Clear to auscultation bilaterally.  HEART: Regular rate and rhythm. ABDOMEN: Soft, mild suprapubic tenderness, nondistended. No organomegaly. PELVIC: Normal external female genitalia. Vagina is pink and rugated.  Normal discharge. Normal appearing cervix. Uterus is normal in size. No adnexal mass. Mild lower abdominal tenderness EXTREMITIES: No cyanosis, clubbing, or edema, 2+ distal pulses.  Results for orders placed or performed during the hospital encounter of 10/18/17 (from the past 24 hour(s))  Urinalysis, Routine w reflex microscopic     Status: Abnormal   Collection Time: 10/18/17  9:04 PM  Result Value Ref Range   Color, Urine YELLOW YELLOW  APPearance CLEAR CLEAR   Specific Gravity, Urine 1.017 1.005 - 1.030   pH 7.0 5.0 - 8.0   Glucose, UA NEGATIVE NEGATIVE mg/dL   Hgb urine dipstick SMALL (A) NEGATIVE   Bilirubin Urine NEGATIVE NEGATIVE   Ketones, ur NEGATIVE NEGATIVE mg/dL   Protein, ur 30 (A) NEGATIVE mg/dL   Nitrite NEGATIVE NEGATIVE   Leukocytes, UA NEGATIVE NEGATIVE   RBC / HPF 0-5 0 - 5 RBC/hpf   WBC, UA  0-5 0 - 5 WBC/hpf   Bacteria, UA RARE (A) NONE SEEN   Squamous Epithelial / LPF 0-5 0 - 5   Mucus PRESENT   Comprehensive metabolic panel     Status: Abnormal   Collection Time: 10/18/17  9:09 PM  Result Value Ref Range   Sodium 138 135 - 145 mmol/L   Potassium 3.6 3.5 - 5.1 mmol/L   Chloride 103 101 - 111 mmol/L   CO2 24 22 - 32 mmol/L   Glucose, Bld 86 65 - 99 mg/dL   BUN 7 6 - 20 mg/dL   Creatinine, Ser 1.61 0.44 - 1.00 mg/dL   Calcium 9.7 8.9 - 09.6 mg/dL   Total Protein 7.2 6.5 - 8.1 g/dL   Albumin 4.7 3.5 - 5.0 g/dL   AST 15 15 - 41 U/L   ALT 9 (L) 14 - 54 U/L   Alkaline Phosphatase 62 38 - 126 U/L   Total Bilirubin 0.6 0.3 - 1.2 mg/dL   GFR calc non Af Amer >60 >60 mL/min   GFR calc Af Amer >60 >60 mL/min   Anion gap 11 5 - 15  CBC WITH DIFFERENTIAL     Status: Abnormal   Collection Time: 10/18/17  9:09 PM  Result Value Ref Range   WBC 9.6 4.0 - 10.5 K/uL   RBC 3.88 3.87 - 5.11 MIL/uL   Hemoglobin 12.2 12.0 - 15.0 g/dL   HCT 04.5 (L) 40.9 - 81.1 %   MCV 92.0 78.0 - 100.0 fL   MCH 31.4 26.0 - 34.0 pg   MCHC 34.2 30.0 - 36.0 g/dL   RDW 91.4 78.2 - 95.6 %   Platelets 279 150 - 400 K/uL   Neutrophils Relative % 83 %   Neutro Abs 8.0 (H) 1.7 - 7.7 K/uL   Lymphocytes Relative 9 %   Lymphs Abs 0.9 0.7 - 4.0 K/uL   Monocytes Relative 7 %   Monocytes Absolute 0.6 0.1 - 1.0 K/uL   Eosinophils Relative 1 %   Eosinophils Absolute 0.1 0.0 - 0.7 K/uL   Basophils Relative 0 %   Basophils Absolute 0.0 0.0 - 0.1 K/uL   Immature Granulocytes 0 %   Abs Immature Granulocytes 0.0 0.0 - 0.1 K/uL  hCG, quantitative, pregnancy     Status: Abnormal   Collection Time: 10/18/17  9:09 PM  Result Value Ref Range   hCG, Beta Chain, Quant, S 21 (H) <5 mIU/mL  I-Stat CG4 Lactic Acid, ED  (not at  [facility])     Status: None   Collection Time: 10/18/17  9:21 PM  Result Value Ref Range   Lactic Acid, Venous 1.09 0.5 - 1.9 mmol/L  I-Stat CG4 Lactic Acid, ED  (not at  [facility])     Status: None    Collection Time: 10/18/17 11:23 PM  Result Value Ref Range   Lactic Acid, Venous 0.65 0.5 - 1.9 mmol/L  Wet prep, genital     Status: Abnormal   Collection Time: 10/19/17 12:11 AM  Result Value  Ref Range   Yeast Wet Prep HPF POC NONE SEEN NONE SEEN   Trich, Wet Prep NONE SEEN NONE SEEN   Clue Cells Wet Prep HPF POC PRESENT (A) NONE SEEN   WBC, Wet Prep HPF POC MANY (A) NONE SEEN   Sperm NONE SEEN     US Ob Comp Less 14 Wks  Result Date: 10/19/2017 CLINICAL DATA:  Assess for retained products of conception. EXAM: OBSTETRIC <14 WK [place] AND TRANSVAGINAL OB US TECHNIQUE: Both transabdominal and transvaginal ultrasound examinations were performed for complete evaluation of the gestation as well as the maternal uterus, adnexal regions, and pelvic cul-de-sac. Transvaginal technique was performed to assess early pregnancy. COMPARISON:  Pelvic ultrasound performed 10/10/2017 FINDINGS: Intrauterine gestational sac: None seen. Yolk sac:  N/A Embryo:  N/A Subchorionic hemorrhage:  None visualized. Maternal uterus/adnexae: Mild blood flow is suggested extending to the endometrial echo complex on color Doppler evaluation, raising question for retained products of conception. The ovaries are unremarkable in appearance. The right ovary measures 3.9 x 2.9 x 2.4 cm. The left ovary measures 3.5 x 2.7 x 2.1 cm. No suspicious adnexal masses are seen; there is no evidence for ovarian torsion. A small amount of free fluid is noted within the pelvic cul-de-sac. IMPRESSION: Mild blood flow suggested extending to the endometrial echo complex on color Doppler evaluation, raising question for retained products of conception. These results were called by telephone at the time of interpretation on 10/19/2017 at 1:37 am to [facility] UPSTILL PA, who verbally acknowledged these results. Electronically Signed   By: Roanna Raider M.D.   On: 10/19/2017 01:38   US Ob Transvaginal  Result Date: 10/19/2017 CLINICAL DATA:  Assess for retained  products of conception. EXAM: OBSTETRIC <14 WK [place] AND TRANSVAGINAL OB US TECHNIQUE: Both transabdominal and transvaginal ultrasound examinations were performed for complete evaluation of the gestation as well as the maternal uterus, adnexal regions, and pelvic cul-de-sac. Transvaginal technique was performed to assess early pregnancy. COMPARISON:  Pelvic ultrasound performed 10/10/2017 FINDINGS: Intrauterine gestational sac: None seen. Yolk sac:  N/A Embryo:  N/A Subchorionic hemorrhage:  None visualized. Maternal uterus/adnexae: Mild blood flow is suggested extending to the endometrial echo complex on color Doppler evaluation, raising question for retained products of conception. The ovaries are unremarkable in appearance. The right ovary measures 3.9 x 2.9 x 2.4 cm. The left ovary measures 3.5 x 2.7 x 2.1 cm. No suspicious adnexal masses are seen; there is no evidence for ovarian torsion. A small amount of free fluid is noted within the pelvic cul-de-sac. IMPRESSION: Mild blood flow suggested extending to the endometrial echo complex on color Doppler evaluation, raising question for retained products of conception. These results were called by telephone at the time of interpretation on 10/19/2017 at 1:37 am to [facility] UP[facility] PA, who verbally acknowledged these results. Electronically Signed   By: Roanna Raider M.D.   On: 10/19/2017 01:38    Assessment/Plan: 22 yo G1P0010 s/p medically induced termination of pregnancy with fever and ultrasound findings suggestive of retained POC - Discussed surgical management with dilatation and evacuation. Risks, benefits and alternatives were explained including but not limited to risks of bleeding, infection, uterine perforation and damage to adjacent organs. Patient verbalized understanding and all questions were answered  Andrea Foley 10/19/2017, 4:05 AM

## 2017-10-20 ENCOUNTER — Encounter (HOSPITAL_COMMUNITY): Payer: Self-pay | Admitting: Obstetrics and Gynecology

## 2017-10-23 LAB — CULTURE, BLOOD (ROUTINE X 2)
Culture: NO GROWTH
Culture: NO GROWTH

## 2017-10-24 ENCOUNTER — Encounter (INDEPENDENT_AMBULATORY_CARE_PROVIDER_SITE_OTHER): Payer: Self-pay

## 2017-10-30 ENCOUNTER — Encounter: Payer: BLUE CROSS/BLUE SHIELD | Admitting: Obstetrics and Gynecology

## 2017-11-07 ENCOUNTER — Telehealth: Payer: Self-pay | Admitting: Obstetrics and Gynecology

## 2017-11-07 NOTE — Telephone Encounter (Signed)
Patient missed her post op appointment.  Just received notification today that she did not read her My Chart message.  Left voice mail for patient to call me back.  She needs to reschedule her post op appt with Dr. Jolayne Pantheronstant.

## 2018-12-18 ENCOUNTER — Other Ambulatory Visit: Payer: Self-pay

## 2018-12-18 DIAGNOSIS — Z20822 Contact with and (suspected) exposure to covid-19: Secondary | ICD-10-CM

## 2018-12-22 LAB — NOVEL CORONAVIRUS, NAA: SARS-CoV-2, NAA: NOT DETECTED

## 2018-12-27 ENCOUNTER — Telehealth: Payer: Self-pay | Admitting: Obstetrics and Gynecology

## 2018-12-27 NOTE — Telephone Encounter (Signed)
Patient was advised of her negative COVID resuls. Patient expressed understanding.

## 2019-03-19 ENCOUNTER — Other Ambulatory Visit: Payer: Self-pay | Admitting: Otolaryngology

## 2019-03-19 DIAGNOSIS — R590 Localized enlarged lymph nodes: Secondary | ICD-10-CM

## 2019-03-25 ENCOUNTER — Ambulatory Visit
Admission: RE | Admit: 2019-03-25 | Discharge: 2019-03-25 | Disposition: A | Discharge status: Home / Self Care | Payer: Commercial Managed Care - PPO | Source: Ambulatory Visit | Attending: Otolaryngology | Admitting: Otolaryngology

## 2019-03-25 DIAGNOSIS — R590 Localized enlarged lymph nodes: Secondary | ICD-10-CM

## 2019-11-04 ENCOUNTER — Other Ambulatory Visit: Payer: Self-pay

## 2019-11-04 ENCOUNTER — Encounter (HOSPITAL_COMMUNITY): Payer: Self-pay

## 2019-11-04 ENCOUNTER — Ambulatory Visit (HOSPITAL_COMMUNITY)
Admission: EM | Admit: 2019-11-04 | Discharge: 2019-11-04 | Disposition: A | Discharge status: Home / Self Care | Payer: Commercial Managed Care - PPO | Attending: Family Medicine | Admitting: Family Medicine

## 2019-11-04 DIAGNOSIS — J069 Acute upper respiratory infection, unspecified: Secondary | ICD-10-CM

## 2019-11-04 MED ORDER — HYDROCODONE-HOMATROPINE 5-1.5 MG/5ML PO SYRP: 5.0000 mL | ORAL_SOLUTION | Freq: Four times a day (QID) | ORAL | 0 refills | Status: DC | PRN

## 2019-11-04 NOTE — ED Triage Notes (Addendum)
Pt reports cough, fever 101.0 F, and body aches x 1 day. Pt denies sore throat.   May 3rd was the second dose of Pfizer COVID vaccine.

## 2019-11-04 NOTE — ED Provider Notes (Signed)
[facility]   [id number] 11/04/19 Arrival Time: 1937  ASSESSMENT & PLAN:  1. Viral URI with cough      Meds ordered this encounter  Medications  . HYDROcodone-homatropine (HYCODAN) 5-1.5 MG/5ML syrup    Sig: Take 5 mLs by mouth every 6 (six) hours as needed for cough.    Dispense:  90 mL    Refill:  0    OTC symptom care as needed.  Follow-up Information    Judie Bonus, MD.   Specialty: Obstetrics and Gynecology Why: As needed. Contact information: [address] [place] [address] [place].   Specialty: Urgent Care Why: If worsening or failing to improve as anticipated. Contact information: [place] [phone]          Reviewed expectations re: course of current medical issues. Questions answered. Outlined signs and symptoms indicating need for more acute intervention. Understanding verbalized. After Visit Summary given.   SUBJECTIVE: History from: patient. Andrea Foley is a 24 y.o. female who reports fever/chills along with cough. Abrupt onset yesterday evening. No sick contacts known. Denies: difficulty breathing. Normal PO intake without n/v/d. COVID vaccine one month ago.   OBJECTIVE:  Vitals:   11/04/19 1215  BP: 131/67  Pulse: 66  Resp: 16  Temp: 99.1 F (37.3 C)  TempSrc: Oral  SpO2: 100%    General appearance: alert; no distress Eyes: PERRLA; EOMI; conjunctiva normal HENT: [place]; AT; nasal mucosa normal; oral mucosa normal Neck: supple  Lungs: speaks full sentences without difficulty; unlabored Extremities: no edema Skin: warm and dry Neurologic: normal gait Psychological: alert and cooperative; normal mood and affect    No Known Allergies  Past Medical History:  Diagnosis Date  . Recurrent fever 09/22/2013   Social History   Socioeconomic History  . Marital status: Single   Spouse name: n/a  . Number of children: 0  . Years of education: 12th grade  . Highest education level: Not on file  Occupational History  . Occupation: waitress    Comment: [employer]  Tobacco Use  . Smoking status: Never Smoker  . Smokeless tobacco: Never Used  Substance and Sexual Activity  . Alcohol use: Yes    Alcohol/week: 0.0 standard drinks    Comment: occ  . Drug use: Yes    Types: Marijuana  . Sexual activity: Never    Birth control/protection: Pill  Other Topics Concern  . Not on file  Social History Narrative   Graduated from [employer] 2015.   Lives with her best friend.   Family lives in [place], [place].   Social Determinants of Health   Financial Resource Strain:   . Difficulty of Paying Living Expenses:   Food Insecurity:   . Worried About Charity fundraiser in the Last Year:   . Arboriculturist in the Last Year:   Transportation Needs:   . Film/video editor (Medical):   Marland Kitchen Lack of Transportation (Non-Medical):   Physical Activity:   . Days of Exercise per Week:   . Minutes of Exercise per Session:   Stress:   . Feeling of Stress :   Social Connections:   . Frequency of Communication with Friends and Family:   . Frequency of Social Gatherings with Friends and Family:   . Attends Religious Services:   . Active Member of Clubs or Organizations:   .  Attends Banker Meetings:   Marland Kitchen Marital Status:   Intimate Partner Violence:   . Fear of Current or Ex-Partner:   . Emotionally Abused:   Marland Kitchen Physically Abused:   . Sexually Abused:    Family History  Problem Relation Age of Onset  . Hypertension Father   . Hyperlipidemia Father   . Diabetes Father    Past Surgical History:  Procedure Laterality Date  . DILATION AND EVACUATION N/A 10/19/2017   Procedure: DILATATION AND EVACUATION;  Surgeon: Catalina Antigua, MD;  Location: [facility];  Service: Gynecology;  Laterality: N/A;  . WISDOM TOOTH EXTRACTION       Mardella Layman,  MD 11/04/19 1247

## 2019-11-04 NOTE — Discharge Instructions (Signed)
Be aware, your cough medication may cause drowsiness. Please do not drive, operate heavy machinery or make important decisions while on this medication, it can cloud your judgement.  

## 2019-12-12 ENCOUNTER — Other Ambulatory Visit: Payer: Self-pay

## 2019-12-12 ENCOUNTER — Encounter (HOSPITAL_COMMUNITY): Payer: Self-pay

## 2019-12-12 ENCOUNTER — Ambulatory Visit (HOSPITAL_COMMUNITY)
Admission: EM | Admit: 2019-12-12 | Discharge: 2019-12-12 | Disposition: A | Discharge status: Home / Self Care | Payer: Commercial Managed Care - PPO | Attending: Family Medicine | Admitting: Family Medicine

## 2019-12-12 DIAGNOSIS — J069 Acute upper respiratory infection, unspecified: Secondary | ICD-10-CM

## 2019-12-12 DIAGNOSIS — Z79899 Other long term (current) drug therapy: Secondary | ICD-10-CM | POA: Diagnosis not present

## 2019-12-12 DIAGNOSIS — Z791 Long term (current) use of non-steroidal anti-inflammatories (NSAID): Secondary | ICD-10-CM | POA: Diagnosis not present

## 2019-12-12 DIAGNOSIS — Z8759 Personal history of other complications of pregnancy, childbirth and the puerperium: Secondary | ICD-10-CM | POA: Insufficient documentation

## 2019-12-12 DIAGNOSIS — Z20822 Contact with and (suspected) exposure to covid-19: Secondary | ICD-10-CM | POA: Diagnosis not present

## 2019-12-12 LAB — SARS CORONAVIRUS 2 (TAT 6-24 HRS): SARS Coronavirus 2: NEGATIVE

## 2019-12-12 NOTE — ED Triage Notes (Signed)
Pt presents to UC for nasal congestion, cough, and sore throat since Wednesday. Pt denies fevers, body aches, n/v/d, headaches. Pt endorses itchy watery eyes. Pt denies seasonally allergies, states she gets sinus infections when the seasons change. Pt has been taking mucinex with moderate relief, last dose 7/8 PM. Pt requesting covid testing.

## 2019-12-12 NOTE — Discharge Instructions (Signed)
Push fluids to ensure adequate hydration and keep secretions thin.  Tylenol and/or ibuprofen as needed for pain or fevers.  Over the counter medications as needed for symptoms.  Rest as able.  We will notify of you any positive findings or if any changes to treatment are needed. If normal or otherwise without concern to your results, we will not call you. Please log on to your MyChart to review your results if interested in so.

## 2019-12-12 NOTE — ED Provider Notes (Signed)
[facility]    CSN: [id number] Arrival date & time: 12/12/19  7858      History   Chief Complaint Chief Complaint  Patient presents with  . Nasal Congestion    HPI Andrea Foley is a 24 y.o. female.   Andrea Foley presents with complaints of nasal drainage and sore throat which started two days ago. Yesterday worsened and developed cough. Some shortness of breath with cough. No fevers but has had hot flashes. Bilateral ear pressure. Has felt "queasy" but no diarrhea or constipation. No known ill contacts but her boyfriend has been ill with same onset. They had been around a group of people in 7/4. She completed her covid-19 vaccination in May. Took mucinex dm yesterday which did help with her symptoms. She is travelling by plane in three days so is requesting covid testing prior to this.    ROS per HPI, negative if not otherwise mentioned.      Past Medical History:  Diagnosis Date  . 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 Retained products of conception following abortion   . Seasonal allergies 09/22/2013    Past Surgical History:  Procedure Laterality Date  . DILATION AND EVACUATION N/A 10/19/2017   Procedure: DILATATION AND EVACUATION;  Surgeon: Catalina Antigua, MD;  Location: [facility];  Service: Gynecology;  Laterality: N/A;  . WISDOM TOOTH EXTRACTION      OB History   No obstetric history on file.      Home Medications    Prior to Admission medications   Medication Sig Start Date End Date Taking? Authorizing Provider  HYDROcodone-homatropine (HYCODAN) 5-1.5 MG/5ML syrup Take 5 mLs by mouth every 6 (six) hours as needed for cough. 11/04/19  Yes Mardella Layman, MD  ibuprofen (ADVIL,MOTRIN) 600 MG tablet Take 1 tablet (600 mg total) by mouth every 6 (six) hours as needed. 10/19/17  Yes Constant, Peggy, MD  JUNEL FE 1/20 1-20 MG-MCG tablet Take 1 tablet by mouth daily. 09/08/19  Yes [provider]  doxycycline (VIBRAMYCIN) 100 MG capsule Take 1 capsule (100 mg total) by mouth 2 (two) times daily. 10/19/17   Constant, Peggy, MD  ipratropium (ATROVENT) 0.03 % nasal spray Place 2 sprays into both nostrils 2 (two) times daily. Patient not taking: Reported on 10/18/2017 09/13/14   Porfirio Oar, PA  oxyCODONE-acetaminophen (PERCOCET/ROXICET) 5-325 MG tablet Take 1-2 tablets by mouth every 6 (six) hours as needed. 10/19/17   Constant, Peggy, MD    Family History Family History  Problem Relation Age of Onset  . Hypertension Father   . Hyperlipidemia Father   . Diabetes Father     Social History Social History   Tobacco Use  . Smoking status: Never Smoker  . Smokeless tobacco: Never Used  Substance Use Topics  . Alcohol use: Yes    Alcohol/week: 0.0 standard drinks    Comment: occ  . Drug use: Yes    Types: Marijuana     Allergies   Patient has no known allergies.   Review of Systems Review of Systems   Physical Exam Triage Vital Signs ED Triage Vitals  Enc Vitals Group     BP 12/12/19 0954 (!) 120/56     Pulse Rate 12/12/19 0954 88     Resp --      Temp 12/12/19 0954 98.6 F (37 C)     Temp Source 12/12/19 0954 Oral     SpO2 12/12/19  0954 100 %     Weight --      Height --      Head Circumference --      Peak Flow --      Pain Score 12/12/19 0955 3     Pain Loc --      Pain Edu? --      Excl. in GC? --    No data found.  Updated Vital Signs BP (!) 120/56 (BP Location: Left Arm)   Pulse 88   Temp 98.6 F (37 C) (Oral)   LMP 10/12/2019 (Approximate)   SpO2 100%    Physical Exam Constitutional:      General: She is not in acute distress.    Appearance: She is well-developed.  HENT:     Head: Normocephalic.     Right Ear: Tympanic membrane, ear canal and external ear normal.     Left Ear: Tympanic membrane, ear canal and external ear normal.     Nose: Rhinorrhea present.     Mouth/Throat:     Mouth: Mucous membranes are moist.      Pharynx: Oropharynx is clear. No oropharyngeal exudate.  Cardiovascular:     Rate and Rhythm: Normal rate and regular rhythm.  Pulmonary:     Effort: Pulmonary effort is normal.     Breath sounds: Normal breath sounds.  Skin:    General: Skin is warm and dry.  Neurological:     Mental Status: She is alert and oriented to person, place, and time.      UC Treatments / Results  Labs (all labs ordered are listed, but only abnormal results are displayed) Labs Reviewed  SARS CORONAVIRUS 2 (TAT 6-24 HRS)    EKG   Radiology No results found.  Procedures Procedures (including critical care time)  Medications Ordered in UC Medications - No data to display  Initial Impression / Assessment and Plan / UC Course  I have reviewed the triage vital signs and the nursing notes.  Pertinent labs & imaging results that were available during my care of the patient were reviewed by me and considered in my medical decision making (see chart for details).     Non toxic. Benign physical exam.  History and physical consistent with viral illness.  Supportive cares recommended. covid testing pending. Vaccinated for covid-19. Return precautions provided. Patient verbalized understanding and agreeable to plan.   Final Clinical Impressions(s) / UC Diagnoses   Final diagnoses:  Upper respiratory tract infection, unspecified type     Discharge Instructions     Push fluids to ensure adequate hydration and keep secretions thin.  Tylenol and/or ibuprofen as needed for pain or fevers.  Over the counter medications as needed for symptoms.  Rest as able.  We will notify of you any positive findings or if any changes to treatment are needed. If normal or otherwise without concern to your results, we will not call you. Please log on to your MyChart to review your results if interested in so.     ED Prescriptions    None     PDMP not reviewed this encounter.   Georgetta Haber, NP 12/12/19  1031

## 2020-06-03 ENCOUNTER — Ambulatory Visit (INDEPENDENT_AMBULATORY_CARE_PROVIDER_SITE_OTHER): Payer: Commercial Managed Care - PPO

## 2020-06-03 ENCOUNTER — Ambulatory Visit (HOSPITAL_COMMUNITY)
Admission: EM | Admit: 2020-06-03 | Discharge: 2020-06-03 | Disposition: A | Discharge status: Home / Self Care | Payer: Commercial Managed Care - PPO | Attending: Emergency Medicine | Admitting: Emergency Medicine

## 2020-06-03 ENCOUNTER — Encounter (HOSPITAL_COMMUNITY): Payer: Self-pay | Admitting: Emergency Medicine

## 2020-06-03 ENCOUNTER — Other Ambulatory Visit: Payer: Self-pay

## 2020-06-03 DIAGNOSIS — M546 Pain in thoracic spine: Secondary | ICD-10-CM

## 2020-06-03 DIAGNOSIS — M5441 Lumbago with sciatica, right side: Secondary | ICD-10-CM

## 2020-06-03 MED ORDER — ACETAMINOPHEN 160 MG/5ML PO SUSP
ORAL | Status: AC
  Filled 2020-06-03: qty 10

## 2020-06-03 MED ORDER — KETOROLAC TROMETHAMINE 30 MG/ML IJ SOLN
INTRAMUSCULAR | Status: AC
  Filled 2020-06-03: qty 1

## 2020-06-03 MED ORDER — KETOROLAC TROMETHAMINE 30 MG/ML IJ SOLN
30.0000 mg | Freq: Once | INTRAMUSCULAR | Status: AC
  Administered 2020-06-03: 30 mg via INTRAMUSCULAR

## 2020-06-03 MED ORDER — TIZANIDINE HCL 4 MG PO TABS: 4.0000 mg | ORAL_TABLET | Freq: Three times a day (TID) | ORAL | 0 refills | Status: AC | PRN

## 2020-06-03 MED ORDER — METHYLPREDNISOLONE 4 MG PO TBPK: ORAL_TABLET | Freq: Every day | ORAL | 0 refills | Status: AC

## 2020-06-03 MED ORDER — ACETAMINOPHEN 160 MG/5ML PO SOLN
ORAL | Status: AC
  Filled 2020-06-03: qty 20.3

## 2020-06-03 MED ORDER — IBUPROFEN 600 MG PO TABS: 600.0000 mg | ORAL_TABLET | Freq: Four times a day (QID) | ORAL | 0 refills | Status: AC | PRN

## 2020-06-03 MED ORDER — ACETAMINOPHEN 325 MG PO TABS
975.0000 mg | ORAL_TABLET | Freq: Once | ORAL | Status: AC
  Administered 2020-06-03: 975 mg via ORAL

## 2020-06-03 NOTE — Discharge Instructions (Addendum)
Take 600 mg of ibuprofen combined with 1000 mg of Tylenol together 3-4 times a day as needed for pain.  Zanaflex will help with muscle spasms.  Your films were negative for fracture.  You had normal disc height in between the vertebrae, which is good.  There is no evidence of arthritis.  Finish steroids, unless a provider tells you to stop.  Ice or heat, whichever feels better, gentle stretching.  You can try deep tissue massage as well.

## 2020-06-03 NOTE — ED Triage Notes (Signed)
Patient c/o bilateral lower back pain w/ radiation into leg x 1 day.   Patient denies falls or trauma.   Patient endorses increased pain w/ ambulation and  "feeling hot when walking".   Patient has taken Ibuprofen w/ no relief of symptoms.

## 2020-06-03 NOTE — ED Provider Notes (Signed)
HPI  SUBJECTIVE:  Andrea Foley is a 24 y.o. female who presents with constant achy midline back pain starting yesterday.  States that she was having this pain but it got worse after she was bending forward to pick something up.  She states that she had pain shoot down both her legs followed by worsening of her pain.  She states that her knees feel weak secondary to the pain.  She tried 800 mg of ibuprofen and heating pad without improvement in her symptoms.  Symptoms are worse with walking, movement.  She denies recent trauma, fevers, flank pain, abdominal pain, pelvic pain, urinary urgency, frequency, dysuria, cloudy or odorous urine, hematuria.  No GU complaints.  No saddle anesthesia, distal weakness/numbness, bladder/ bowel incontinence, urinary retention.  Patient has a past medical history of asymmetric legs and has been seeing a chiropractor for left lateral thigh numbness for the past 12 years.  It has not changed today.  No history of CA / multiple myleoma,  Osteopenia/osteoporosis, IVDU,  HIV,  known AAA, diabetes, hypertension, previous back injury.  LMP: 12/25.  Denies the possibility being pregnant.  PMD: None.  She sees Dr. Roxan Hockey, OB/GYN for routine care.   Past Medical History:  Diagnosis Date  . 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 DILATION AND EVACUATION N/A 10/19/2017   Procedure: DILATATION AND EVACUATION;  Surgeon: Catalina Antigua, MD;  Location: [facility];  Service: Gynecology;  Laterality: N/A;  . WISDOM TOOTH EXTRACTION      Family History  Problem Relation Age of Onset  . Hypertension Father   . Hyperlipidemia Father   . Diabetes Father     Social History   Tobacco Use  . Smoking status: Never Smoker  . Smokeless tobacco: Never Used  Substance Use Topics  . Alcohol use: Yes    Alcohol/week: 0.0 standard drinks    Comment: occ  . Drug use: Yes    Types: Marijuana     Current Facility-Administered  Medications:  .  acetaminophen (TYLENOL) tablet 975 mg, 975 mg, Oral, Once, Domenick Gong, MD .  ketorolac (TORADOL) 30 MG/ML injection 30 mg, 30 mg, Intramuscular, Once, Domenick Gong, MD  Current Outpatient Medications:  .  ibuprofen (ADVIL) 600 MG tablet, Take 1 tablet (600 mg total) by mouth every 6 (six) hours as needed., Disp: 30 tablet, Rfl: 0 .  JUNEL FE 1/20 1-20 MG-MCG tablet, Take 1 tablet by mouth daily., Disp: , Rfl:  .  methylPREDNISolone (MEDROL DOSEPAK) 4 MG TBPK tablet, Take by mouth daily. Follow package instructions, Disp: 21 tablet, Rfl: 0 .  tiZANidine (ZANAFLEX) 4 MG tablet, Take 1 tablet (4 mg total) by mouth every 8 (eight) hours as needed for muscle spasms., Disp: 30 tablet, Rfl: 0  No Known Allergies   ROS  As noted in HPI.   Physical Exam  BP 115/60 (BP Location: Right Arm)   Pulse 78   Temp 98.3 F (36.8 C) (Oral)   Resp 15   Ht 5\' 6"  (1.676 m)   Wt 59.9 kg   LMP 05/29/2020   SpO2 99%   BMI 21.31 kg/m   Constitutional: Well developed, well nourished, appears uncomfortable Eyes:  EOMI, conjunctiva normal bilaterally HENT: Normocephalic, atraumatic,mucus membranes moist Respiratory: Normal inspiratory effort Cardiovascular: Normal rate GI: nondistended. No suprapubic, flank tenderness skin: No rash, skin intact Musculoskeletal: no CVAT. + Bilateral paralumbar and quadratus lumborum tenderness, + muscle spasm.  Positive bony tenderness L4, L5-S1.  Bilateral lower extremities nontender, baseline ROM with intact DP / PT pulses, CR<2 secs all digits bilaterally. No pain with int/ext rotation bilaterally.  Pain with active flex/extension hips bilaterally against resistance.  SLR positive right side. Sensation baseline light touch bilaterally for Pt, DTR's symmetric and intact bilaterally KJ, Motor symmetric bilateral 5/5 hip flexion, quadriceps, hamstrings, EHL, foot dorsiflexion, foot plantarflexion,  Neurologic: Alert & oriented x 3, no focal neuro  deficits Psychiatric: Speech and behavior appropriate   ED Course   Medications  acetaminophen (TYLENOL) tablet 975 mg (has no administration in time range)  ketorolac (TORADOL) 30 MG/ML injection 30 mg (has no administration in time range)    Orders Placed This Encounter  Procedures  . DG Lumbar Spine Complete    Standing Status:   Standing    Number of Occurrences:   1    Order Specific Question:   Reason for Exam (SYMPTOM  OR DIAGNOSIS REQUIRED)    Answer:   Midline bony tenderness rule out acute change.  History of scoliosis.    Order Specific Question:   Is patient pregnant?    Answer:   No    No results found for this or any previous visit (from the past 24 hour(s)). DG Lumbar Spine Complete  Result Date: 06/03/2020 CLINICAL DATA:  Midline bony tenderness EXAM: LUMBAR SPINE - COMPLETE 4+ VIEW COMPARISON:  None. FINDINGS: There is no evidence of lumbar spine fracture. Alignment is normal. Intervertebral disc spaces are maintained. IMPRESSION: Negative. Electronically Signed   By: Jasmine Pang M.D.   On: 06/03/2020 18:10    ED Clinical Impression  1. Acute midline low back pain with right-sided sciatica      ED Assessment/Plan  We will get imaging because of bony tenderness, bilateral radicular pain.  She has a history of asymmetry in her legs and has numbness in her left lateral thigh for the past 12 years.  This has not changed.  Suspect acutely pinched disc or nerve impingement and subsequent muscle spasm.  She has no evidence of cauda equina syndrome.  She has no urinary or GU complaints.  She has no suprapubic or kidney tenderness.  Doubt UTI or pyelonephritis.  UA deferred.  Give Toradol 30 mg IM, Tylenol 975 mg p.o.  X-rays.  Reviewed imaging independently.  No fracture, normal disc spaces.  See radiology report for details.  Films negative for any acute process.  Presentation consistent with acute low back pain with right-sided sciatica.  Will send home with  Tylenol/ibuprofen, Zanaflex, Medrol Dosepak.  Will provide primary care list for ongoing care in case this continues to be an issue.  Discussed imaging medical decision-making, and plan for follow-up with the patient.  Discussed signs and symptoms that should prompt return to the emergency department.  Patient agrees with plan.   Meds ordered this encounter  Medications  . acetaminophen (TYLENOL) tablet 975 mg  . ketorolac (TORADOL) 30 MG/ML injection 30 mg  . ibuprofen (ADVIL) 600 MG tablet    Sig: Take 1 tablet (600 mg total) by mouth every 6 (six) hours as needed.    Dispense:  30 tablet    Refill:  0  . methylPREDNISolone (MEDROL DOSEPAK) 4 MG TBPK tablet    Sig: Take by mouth daily. Follow package instructions    Dispense:  21 tablet    Refill:  0  . tiZANidine (ZANAFLEX) 4 MG tablet    Sig: Take 1 tablet (4 mg total) by mouth every 8 (eight) hours as  needed for muscle spasms.    Dispense:  30 tablet    Refill:  0    *This clinic note was created using Scientist, clinical (histocompatibility and immunogenetics). Therefore, there may be occasional mistakes despite careful proofreading.  ?     Domenick Gong, MD 06/03/20 [place]

## 2021-01-29 IMAGING — DX DG LUMBAR SPINE COMPLETE 4+V
4 series · 4 of 4 positions shown · non-contrast
Comparison: None.

CLINICAL DATA: Midline bony tenderness

EXAM:
LUMBAR SPINE - COMPLETE 4+ VIEW

[l-spine ap]
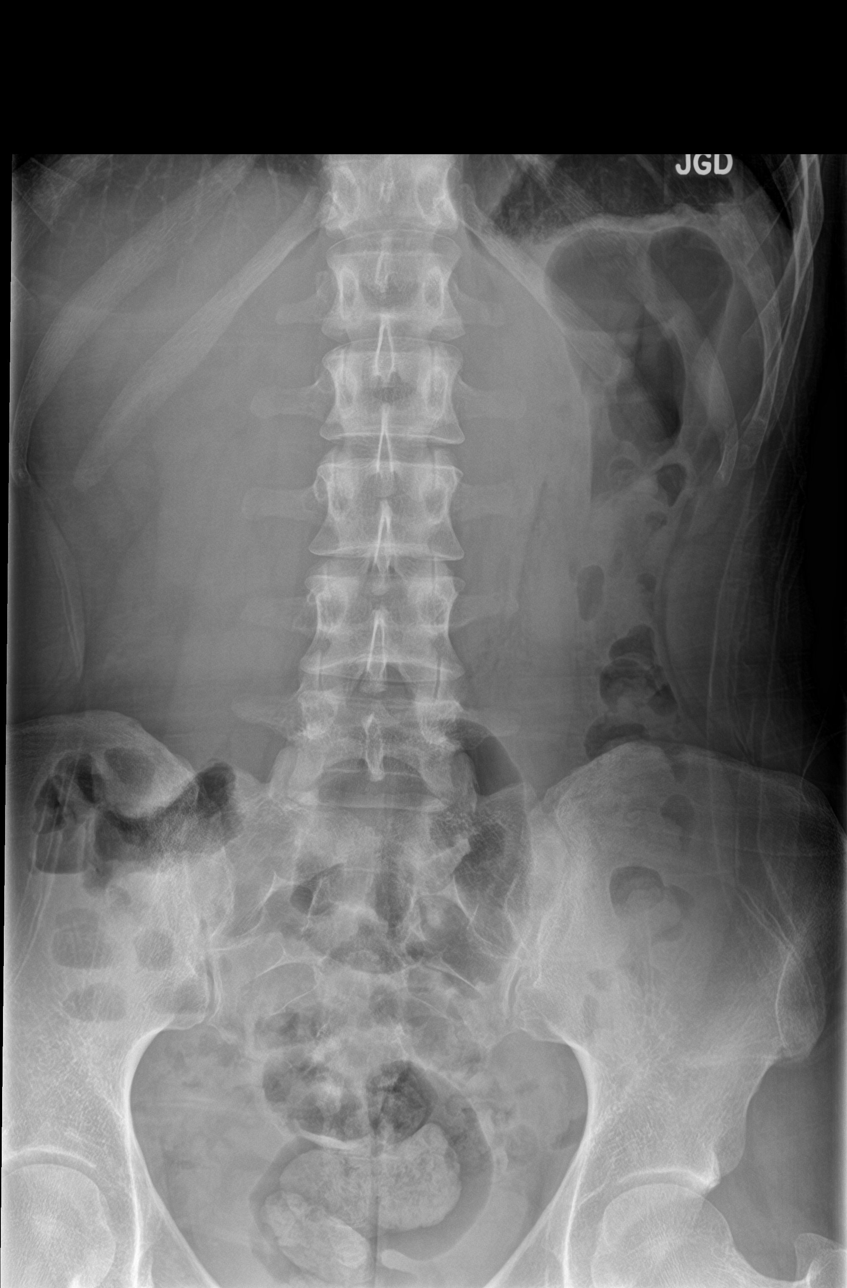

[l-spine obl (1 of 2)]
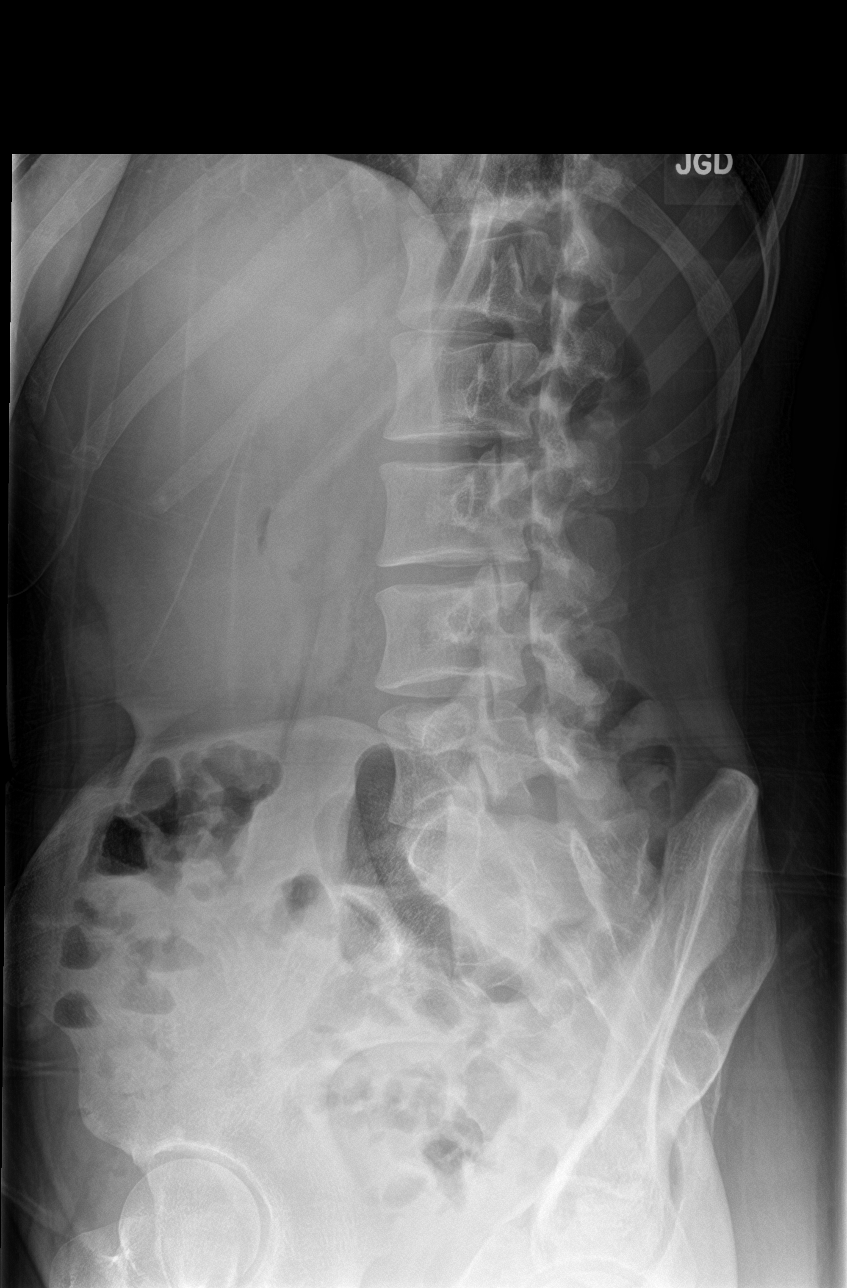

[l-spine obl (2 of 2)]
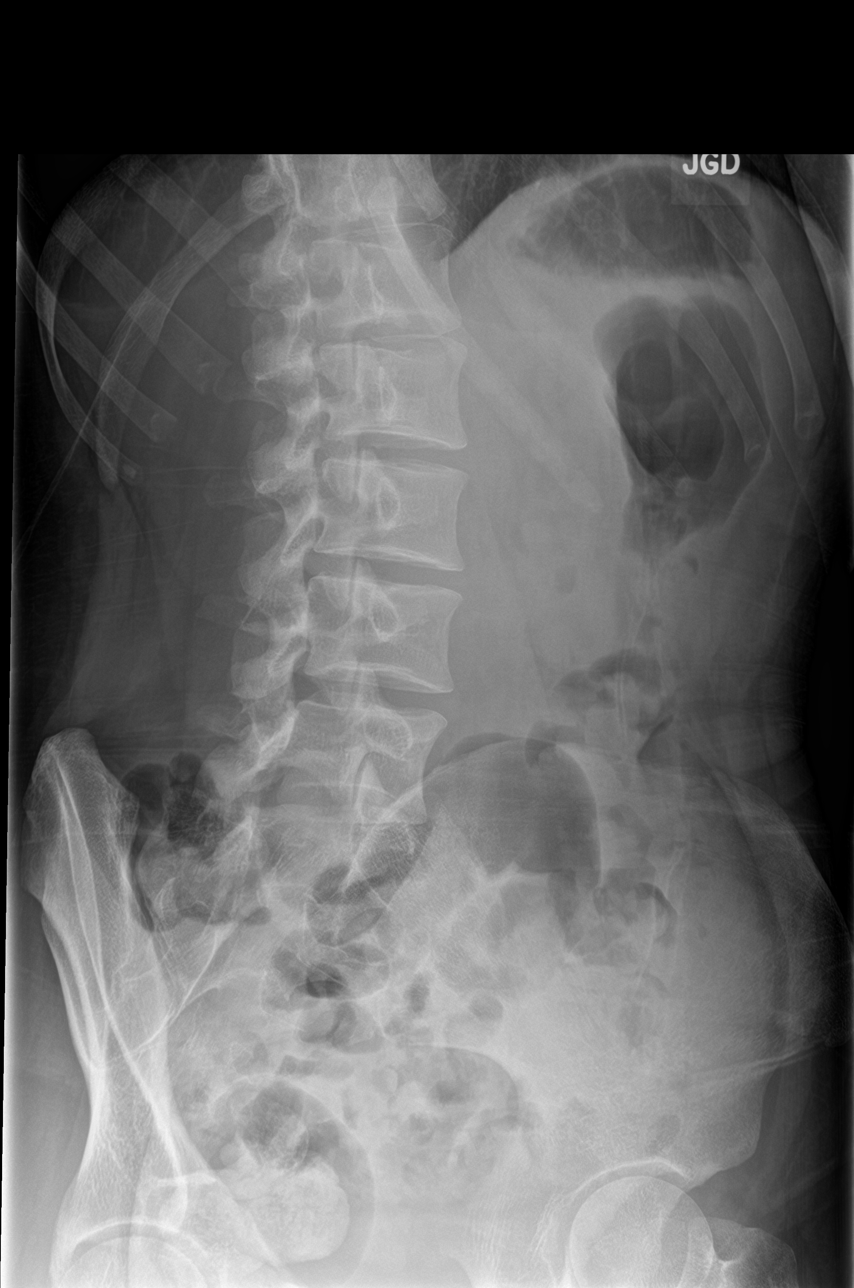

[l-spine lat]
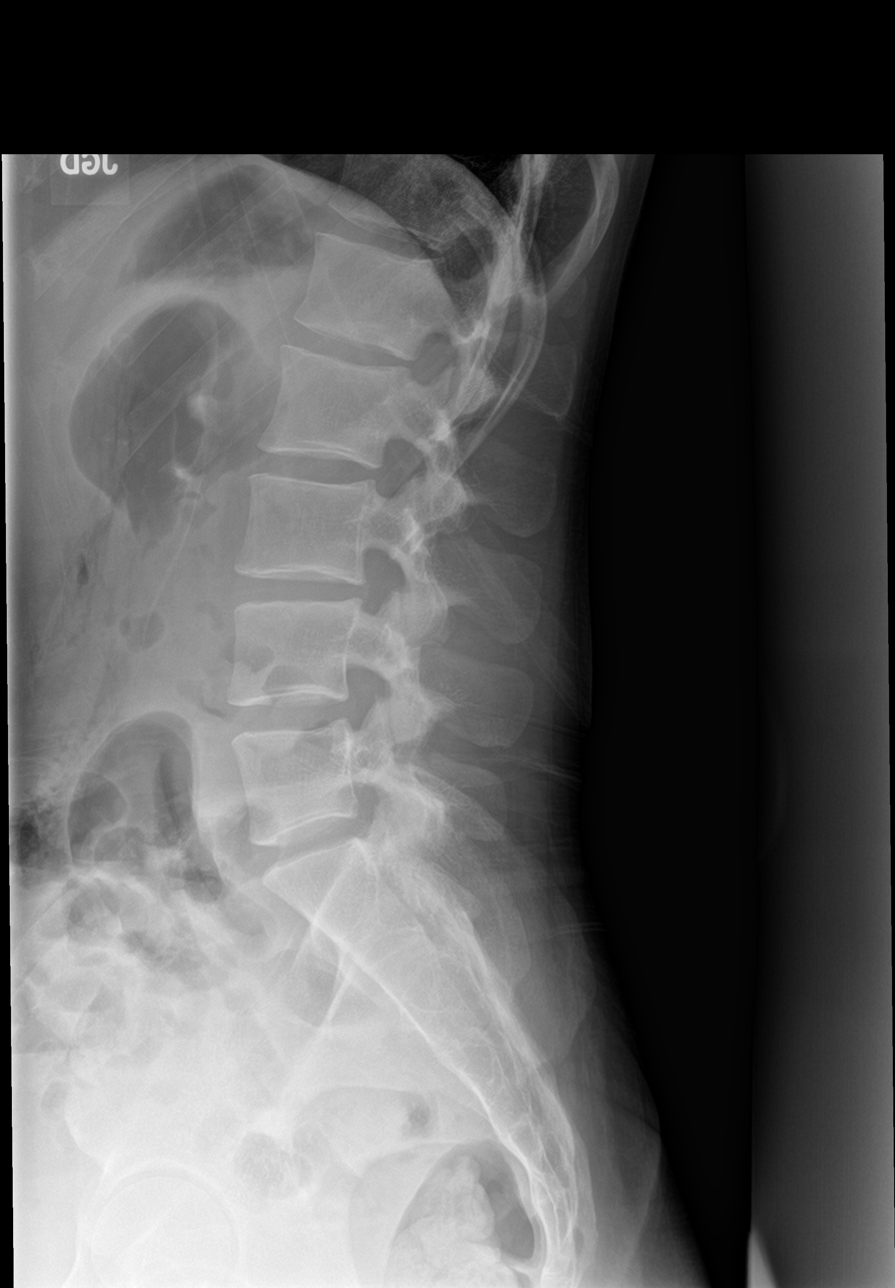

[4 of 4 positions shown; findings below may reference images not displayed]

FINDINGS: There is no evidence of lumbar spine fracture. Alignment is normal.
Intervertebral disc spaces are maintained.
IMPRESSION: Negative.

## 2022-10-12 DIAGNOSIS — Z01419 Encounter for gynecological examination (general) (routine) without abnormal findings: Secondary | ICD-10-CM | POA: Diagnosis not present

## 2022-10-12 DIAGNOSIS — Z793 Long term (current) use of hormonal contraceptives: Secondary | ICD-10-CM | POA: Diagnosis not present

## 2022-10-12 DIAGNOSIS — Z3009 Encounter for other general counseling and advice on contraception: Secondary | ICD-10-CM | POA: Diagnosis not present

## 2024-04-18 DIAGNOSIS — Z01419 Encounter for gynecological examination (general) (routine) without abnormal findings: Secondary | ICD-10-CM | POA: Diagnosis not present

## 2024-04-18 DIAGNOSIS — Z3009 Encounter for other general counseling and advice on contraception: Secondary | ICD-10-CM | POA: Diagnosis not present

## 2024-04-18 NOTE — Progress Notes (Signed)
 LMP:                                        Patient's last menstrual period was 04/17/24  LAST PAP SMEAR:                Date: 2024        Hx of abnormal ?:             Pap Smear: No history of abnormal Pap smears MAMMOGRAM:                      Not of appropriate age COLONOSCOPY:                   Not of appropriate age     CONTRACEPTION:                Combined (regular) OCP's MENSES:                                Monthly and normal, regular and predictable HX OF STD'S:                         Chlamydia SEXUALLY ACTIVE:              Currently sexually active with  One partner RELATIONSHIP:                    Dating and Monogomous DOMESTIC VIOLENCE:        Domestic Violence: No   Annual exam. Periods are much lighter than she is used to.
